# Patient Record
Sex: Female | Born: 1955 | Race: White | Hispanic: No | Marital: Married | Smoking: Never smoker
Health system: Southern US, Community
[De-identification: ages and names within clinical notes are randomized; demographics above are authoritative.]

## PROBLEM LIST (undated history)

## (undated) DIAGNOSIS — I959 Hypotension, unspecified: Secondary | ICD-10-CM

## (undated) DIAGNOSIS — Z9889 Other specified postprocedural states: Secondary | ICD-10-CM

## (undated) DIAGNOSIS — E039 Hypothyroidism, unspecified: Secondary | ICD-10-CM

## (undated) DIAGNOSIS — I499 Cardiac arrhythmia, unspecified: Secondary | ICD-10-CM

## (undated) DIAGNOSIS — I10 Essential (primary) hypertension: Secondary | ICD-10-CM

## (undated) DIAGNOSIS — R112 Nausea with vomiting, unspecified: Secondary | ICD-10-CM

## (undated) HISTORY — PX: CHOLECYSTECTOMY: SHX55

## (undated) HISTORY — PX: ABDOMINAL HYSTERECTOMY: SHX81

## (undated) HISTORY — PX: ABLATION: SHX5711

---

## 1998-04-01 DIAGNOSIS — I499 Cardiac arrhythmia, unspecified: Secondary | ICD-10-CM

## 1998-04-01 HISTORY — DX: Cardiac arrhythmia, unspecified: I49.9

## 1998-10-31 ENCOUNTER — Encounter (INDEPENDENT_AMBULATORY_CARE_PROVIDER_SITE_OTHER): Payer: Self-pay

## 1998-10-31 ENCOUNTER — Other Ambulatory Visit: Admission: RE | Admit: 1998-10-31 | Discharge: 1998-10-31 | Payer: Self-pay | Admitting: Gynecology

## 1998-11-27 ENCOUNTER — Other Ambulatory Visit: Admission: RE | Admit: 1998-11-27 | Discharge: 1998-11-27 | Payer: Self-pay | Admitting: Gynecology

## 2000-03-04 ENCOUNTER — Other Ambulatory Visit: Admission: RE | Admit: 2000-03-04 | Discharge: 2000-03-04 | Payer: Self-pay | Admitting: Gynecology

## 2000-03-26 ENCOUNTER — Encounter (INDEPENDENT_AMBULATORY_CARE_PROVIDER_SITE_OTHER): Payer: Self-pay

## 2000-03-26 ENCOUNTER — Other Ambulatory Visit: Admission: RE | Admit: 2000-03-26 | Discharge: 2000-03-26 | Payer: Self-pay | Admitting: *Deleted

## 2000-04-10 ENCOUNTER — Inpatient Hospital Stay (HOSPITAL_COMMUNITY): Admission: RE | Admit: 2000-04-10 | Discharge: 2000-04-12 | Payer: Self-pay | Admitting: Gynecology

## 2000-04-10 ENCOUNTER — Encounter (INDEPENDENT_AMBULATORY_CARE_PROVIDER_SITE_OTHER): Payer: Self-pay | Admitting: Specialist

## 2001-06-01 ENCOUNTER — Other Ambulatory Visit: Admission: RE | Admit: 2001-06-01 | Discharge: 2001-06-01 | Payer: Self-pay | Admitting: Gynecology

## 2001-11-05 ENCOUNTER — Encounter (INDEPENDENT_AMBULATORY_CARE_PROVIDER_SITE_OTHER): Payer: Self-pay | Admitting: Specialist

## 2001-11-05 ENCOUNTER — Ambulatory Visit (HOSPITAL_COMMUNITY): Admission: RE | Admit: 2001-11-05 | Discharge: 2001-11-05 | Payer: Self-pay | Admitting: Gastroenterology

## 2002-06-28 ENCOUNTER — Other Ambulatory Visit: Admission: RE | Admit: 2002-06-28 | Discharge: 2002-06-28 | Payer: Self-pay | Admitting: Gynecology

## 2004-09-05 ENCOUNTER — Other Ambulatory Visit: Admission: RE | Admit: 2004-09-05 | Discharge: 2004-09-05 | Payer: Self-pay | Admitting: Gynecology

## 2005-11-07 ENCOUNTER — Other Ambulatory Visit: Admission: RE | Admit: 2005-11-07 | Discharge: 2005-11-07 | Payer: Self-pay | Admitting: Gynecology

## 2007-01-01 ENCOUNTER — Other Ambulatory Visit: Admission: RE | Admit: 2007-01-01 | Discharge: 2007-01-01 | Payer: Self-pay | Admitting: Gynecology

## 2008-02-23 ENCOUNTER — Other Ambulatory Visit: Admission: RE | Admit: 2008-02-23 | Discharge: 2008-02-23 | Payer: Self-pay | Admitting: Gynecology

## 2010-08-17 NOTE — H&P (Signed)
[facility]  Patient:    Jody Rodriguez, Jody Rodriguez                    MRN: [id number] Adm. Date:  [id number] Disc. Date: [id number] Attending:  Teodora Medici Cabitt                         History and Physical  ADMISSION DIAGNOSIS:  Pelvic pain.  HISTORY OF PRESENT ILLNESS:  The patient is a 55 year old, gravida 1, para 1 female, admitted with last menstrual period on March 28, 2000, status post tubal ligation, admitted for a total abdominal hysterectomy and bilateral salpingo-oophorectomy.  The patient has a long history of menorrhagia, pelvis pain, and dyspareunia who is admitted for a total abdominal hysterectomy and bilateral salpingo-oophorectomy.  A total abdominal hysterectomy and bilateral salpingo-oophorectomy have been discussed with the patient in detail, and potential complications, including, but not limited to, anesthesia, injury to the bowel, bladder, ureters, possible fistula formation, possible blood loss with transfusion and its sequelae, and possible infection have been discussed in detail with the patient.  The postoperative expectations and restrictions have also been reviewed.  There is no guarantee to relieve the patients pelvic pain.  When the patient appeared for preoperative examination, the hemoglobin was reported as 8.8, and repeated the next day and was 9.2.  The patient stated that her last menstrual flow had been extremely heavy.  PAST SURGICAL HISTORY: 1. Cesarean section. 2. Laparoscopic tubal ligation. 3. Tonsillectomy and appendectomy. 4. Knee.  PAST SURGICAL HISTORY: 1. The patient has had a cardiac catheterization to ablate a nerve to control    a heart arrhythmia. 2. 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  MEDICATIONS:  None.  ALLERGIES:  NEURONTIN.  HABITS:  Smokes none, ETOH occasional.  SOCIAL HISTORY:  The patient has an extremely supportive family.  FAMILY HISTORY:   Noncontributory.  PHYSICAL EXAMINATION:  HEENT:  Negative.  LUNGS:  Clear.  HEART:  Without murmurs.  BREASTS:  Without masses or discharge.  ABDOMEN:  Soft and nontender.  PELVIC:  ______ vagina and cervix to be normal.  The uterus is top normal in size, soft, and very tender to palpation.  The adnexa are without masses.  EXTREMITIES:  Negative.  LABORATORY DATA:  Saline ultrasound was performed which revealed a thick endometrium, and saline ultrasound revealed no gross polyps or fibroids. Endometrial biopsy was performed in December 2001, and revealed benign secretory endometrium, no hyperplasia or malignancy identified.  IMPRESSION: 1. Menorrhagia. 2. Anemia. 3. Pelvic pain. 4. Dyspareunia.  PLAN:  Total abdominal hysterectomy and bilateral salpingo-oophorectomy.  The patient is aware that after the bilateral salpingo-oophorectomy that she will need to consider a hormone replacement therapy. DD:  04/14/00 TD:  04/14/00 Job: [id number]

## 2010-08-17 NOTE — Op Note (Signed)
[facility]  Patient:    Jody Rodriguez, Jody Rodriguez                    MRN: [id number] Proc. Date: 04/10/00 Adm. Date:  [id number] Disc. Date: [id number] Attending:  Teodora Medici Cabitt                           Operative Report  PREOPERATIVE DIAGNOSES:  Pelvic pain, menorrhagia, anemia.  POSTOPERATIVE DIAGNOSES:  Pelvic pain, menorrhagia, anemia.  OPERATION PERFORMED:  Total abdominal hysterectomy and bilateral salpingo-oophorectomy.  SURGEON:  Dr. Teodora Medici.  ASSISTANT:  Dr. Harl Bowie.  ANESTHESIA:  General endotracheal.  PREPARATION:  Betadine.  DESCRIPTION OF PROCEDURE:  With the patient in the supine position, she was prepped and draped in routine fashion. An incision was made through an old Pfannenstiel scar and carried down to the subcutaneous tissue. The fascia and peritoneum were opened without difficulty. Exploration of her upper abdomen revealed no gross abnormalities. Exploration of the pelvis revealed the uterus to be approximately eight weeks in size and the tubes and ovaries were grossly normal bilateral. There was no gross endometriosis. The round ligaments were suture ligated with #1 chromic and divided with cautery. The anterior leaf of the broad ligament was then opened and the bladder was moderately scarred to the uterus in the previous cesarean section and was taken down sharply with care taken to avoid injury or damage to the bladder. The right infundibulopelvic ligament was isolated, clamped, cut and free tied with #1 chromic and then suture ligated with #1 chromic. There were adhesions of bowel extending up the infundibulopelvic ligament on the left side and these were taken down sharply and with cautery to safely expose the ligament. The ligament was exposed, clamped, cut and free tied with #1 chromic and then suture ligated with #1 chromic. The uterine arteries were then clamped, cut and suture ligated with #1 chromic.  The cardinal ligaments taken in several bites clamped, cut and suture ligated with #1 chromic. The uterosacral ligaments were taken separately, clamped, cut and suture ligated with #1 chromic. The cervix was quite large and the fornices were very deep and an effort was made to not take more cardinal ligament than was necessary. The vagina was entered anteriorly and the specimen excised with circumferential dissection. TeLinde type angled sutures were then placed with #1 chromic and the cuff whipped anteriorly and posteriorly with running locked #1 chromic suture. The vascular opening was quite large and was approximately with three interrupted sutures of #1 chromic. Several small bleeding sites at the base of the bladder were arrested with gentle cautery. The bladder flap was then approximated over the vaginal cuff with a running 3-0 Vicryl suture. The ureters were inspected and found to be out of harms way and peristalsing bilaterally. At the completion of the procedure, an effort was made to place the large bowel in the cul-de-sac. The omentum was brought down, the abdomen was closed in layers using a running 2-0 Vicryl on the peritoneum, running #0 Vicryl to midline bilaterally on the fascia. Hemostasis was assured in the subcutaneous tissue and the skin was closed with staples. The estimated blood loss was approximately 250 cc. Sponge, needle and instrument counts were correct x 2. The patient tolerated the procedure well and was taken to the recovery room in satisfactory condition. DD:  04/14/00 TD:  04/14/00 Job: [id number]

## 2013-04-01 HISTORY — PX: CERVICAL SPINE SURGERY: SHX589

## 2017-03-18 ENCOUNTER — Encounter (HOSPITAL_COMMUNITY): Payer: Self-pay | Admitting: Nephrology

## 2017-03-18 ENCOUNTER — Inpatient Hospital Stay (HOSPITAL_COMMUNITY)
Admission: AD | Admit: 2017-03-18 | Discharge: 2017-03-24 | DRG: 438 | Disposition: A | Discharge status: Home / Self Care | Payer: BC Managed Care – PPO | Source: Other Acute Inpatient Hospital | Attending: Internal Medicine | Admitting: Internal Medicine

## 2017-03-18 DIAGNOSIS — K760 Fatty (change of) liver, not elsewhere classified: Secondary | ICD-10-CM | POA: Diagnosis present

## 2017-03-18 DIAGNOSIS — R112 Nausea with vomiting, unspecified: Secondary | ICD-10-CM | POA: Diagnosis not present

## 2017-03-18 DIAGNOSIS — K859 Acute pancreatitis without necrosis or infection, unspecified: Secondary | ICD-10-CM

## 2017-03-18 DIAGNOSIS — E860 Dehydration: Secondary | ICD-10-CM | POA: Diagnosis present

## 2017-03-18 DIAGNOSIS — E039 Hypothyroidism, unspecified: Secondary | ICD-10-CM | POA: Diagnosis present

## 2017-03-18 DIAGNOSIS — R600 Localized edema: Secondary | ICD-10-CM | POA: Diagnosis present

## 2017-03-18 DIAGNOSIS — I10 Essential (primary) hypertension: Secondary | ICD-10-CM

## 2017-03-18 DIAGNOSIS — J9811 Atelectasis: Secondary | ICD-10-CM | POA: Diagnosis not present

## 2017-03-18 DIAGNOSIS — K567 Ileus, unspecified: Secondary | ICD-10-CM | POA: Diagnosis not present

## 2017-03-18 DIAGNOSIS — R0600 Dyspnea, unspecified: Secondary | ICD-10-CM

## 2017-03-18 DIAGNOSIS — Z888 Allergy status to other drugs, medicaments and biological substances status: Secondary | ICD-10-CM

## 2017-03-18 DIAGNOSIS — Z9049 Acquired absence of other specified parts of digestive tract: Secondary | ICD-10-CM | POA: Diagnosis not present

## 2017-03-18 DIAGNOSIS — R0902 Hypoxemia: Secondary | ICD-10-CM

## 2017-03-18 DIAGNOSIS — R072 Precordial pain: Secondary | ICD-10-CM

## 2017-03-18 DIAGNOSIS — K831 Obstruction of bile duct: Secondary | ICD-10-CM | POA: Diagnosis present

## 2017-03-18 DIAGNOSIS — Z79899 Other long term (current) drug therapy: Secondary | ICD-10-CM | POA: Diagnosis not present

## 2017-03-18 DIAGNOSIS — K851 Biliary acute pancreatitis without necrosis or infection: Principal | ICD-10-CM | POA: Diagnosis present

## 2017-03-18 DIAGNOSIS — R1012 Left upper quadrant pain: Secondary | ICD-10-CM

## 2017-03-18 DIAGNOSIS — E8770 Fluid overload, unspecified: Secondary | ICD-10-CM | POA: Diagnosis not present

## 2017-03-18 DIAGNOSIS — D649 Anemia, unspecified: Secondary | ICD-10-CM | POA: Diagnosis not present

## 2017-03-18 DIAGNOSIS — R52 Pain, unspecified: Secondary | ICD-10-CM

## 2017-03-18 DIAGNOSIS — R1011 Right upper quadrant pain: Secondary | ICD-10-CM | POA: Diagnosis not present

## 2017-03-18 DIAGNOSIS — E871 Hypo-osmolality and hyponatremia: Secondary | ICD-10-CM | POA: Diagnosis not present

## 2017-03-18 DIAGNOSIS — R109 Unspecified abdominal pain: Secondary | ICD-10-CM | POA: Diagnosis present

## 2017-03-18 DIAGNOSIS — N289 Disorder of kidney and ureter, unspecified: Secondary | ICD-10-CM | POA: Diagnosis present

## 2017-03-18 HISTORY — DX: Nausea with vomiting, unspecified: R11.2

## 2017-03-18 HISTORY — DX: Hypothyroidism, unspecified: E03.9

## 2017-03-18 HISTORY — DX: Hypotension, unspecified: I95.9

## 2017-03-18 HISTORY — DX: Cardiac arrhythmia, unspecified: I49.9

## 2017-03-18 HISTORY — DX: Essential (primary) hypertension: I10

## 2017-03-18 HISTORY — DX: Nausea with vomiting, unspecified: Z98.890

## 2017-03-18 LAB — CBC WITH DIFFERENTIAL/PLATELET
BASOS ABS: 0 10*3/uL (ref 0.0–0.1)
BASOS PCT: 0 %
EOS PCT: 0 %
Eosinophils Absolute: 0 10*3/uL (ref 0.0–0.7)
HCT: 45 % (ref 36.0–46.0)
Hemoglobin: 15.3 g/dL — ABNORMAL HIGH (ref 12.0–15.0)
Lymphocytes Relative: 9 %
Lymphs Abs: 0.9 10*3/uL (ref 0.7–4.0)
MCH: 32.3 pg (ref 26.0–34.0)
MCHC: 34 g/dL (ref 30.0–36.0)
MCV: 95.1 fL (ref 78.0–100.0)
MONO ABS: 0.5 10*3/uL (ref 0.1–1.0)
Monocytes Relative: 6 %
Neutro Abs: 8.2 10*3/uL — ABNORMAL HIGH (ref 1.7–7.7)
Neutrophils Relative %: 85 %
PLATELETS: 346 10*3/uL (ref 150–400)
RBC: 4.73 MIL/uL (ref 3.87–5.11)
RDW: 13.9 % (ref 11.5–15.5)
WBC: 9.6 10*3/uL (ref 4.0–10.5)

## 2017-03-18 LAB — COMPREHENSIVE METABOLIC PANEL
ALK PHOS: 195 U/L — AB (ref 38–126)
ALT: 261 U/L — AB (ref 14–54)
ANION GAP: 9 (ref 5–15)
AST: 193 U/L — ABNORMAL HIGH (ref 15–41)
Albumin: 3.6 g/dL (ref 3.5–5.0)
BUN: 16 mg/dL (ref 6–20)
CALCIUM: 8.7 mg/dL — AB (ref 8.9–10.3)
CHLORIDE: 104 mmol/L (ref 101–111)
CO2: 23 mmol/L (ref 22–32)
CREATININE: 1.11 mg/dL — AB (ref 0.44–1.00)
GFR, EST NON AFRICAN AMERICAN: 53 mL/min — AB (ref >60)
Glucose, Bld: 125 mg/dL — ABNORMAL HIGH (ref 65–99)
Potassium: 4.9 mmol/L (ref 3.5–5.1)
SODIUM: 136 mmol/L (ref 135–145)
Total Bilirubin: 1.4 mg/dL — ABNORMAL HIGH (ref 0.3–1.2)
Total Protein: 6.8 g/dL (ref 6.5–8.1)

## 2017-03-18 LAB — MAGNESIUM: MAGNESIUM: 1.9 mg/dL (ref 1.7–2.4)

## 2017-03-18 LAB — LIPASE, BLOOD: LIPASE: 1765 U/L — AB (ref 11–51)

## 2017-03-18 LAB — PHOSPHORUS: PHOSPHORUS: 4 mg/dL (ref 2.5–4.6)

## 2017-03-18 MED ORDER — ONDANSETRON HCL 4 MG PO TABS
4.0000 mg | ORAL_TABLET | Freq: Four times a day (QID) | ORAL | Status: DC | PRN
  Administered 2017-03-24: 4 mg via ORAL
  Filled 2017-03-18: qty 1

## 2017-03-18 MED ORDER — SODIUM CHLORIDE 0.45 % IV SOLN
INTRAVENOUS | Status: AC
  Administered 2017-03-18 (×3): via INTRAVENOUS

## 2017-03-18 MED ORDER — SODIUM CHLORIDE 0.9% FLUSH: 9.0000 mL | INTRAVENOUS | Status: DC | PRN

## 2017-03-18 MED ORDER — DIPHENHYDRAMINE HCL 12.5 MG/5ML PO ELIX: 12.5000 mg | ORAL_SOLUTION | Freq: Four times a day (QID) | ORAL | Status: DC | PRN

## 2017-03-18 MED ORDER — NALOXONE HCL 0.4 MG/ML IJ SOLN: 0.4000 mg | INTRAMUSCULAR | Status: DC | PRN

## 2017-03-18 MED ORDER — MORPHINE SULFATE 2 MG/ML IV SOLN
INTRAVENOUS | Status: DC
  Administered 2017-03-18 – 2017-03-19 (×2): via INTRAVENOUS
  Administered 2017-03-19: 15 mg via INTRAVENOUS
  Administered 2017-03-19: 22.5 mg via INTRAVENOUS
  Administered 2017-03-19: 16.45 mg via INTRAVENOUS
  Administered 2017-03-19: 6 mg via INTRAVENOUS
  Administered 2017-03-19: 27 mg via INTRAVENOUS
  Administered 2017-03-20: 20:00:00 via INTRAVENOUS
  Administered 2017-03-20: 35 mg via INTRAVENOUS
  Administered 2017-03-20: 10.5 mg via INTRAVENOUS
  Administered 2017-03-20: 22.5 mg via INTRAVENOUS
  Administered 2017-03-20: 05:00:00 via INTRAVENOUS
  Administered 2017-03-20: 10.5 mg via INTRAVENOUS
  Administered 2017-03-21: 3 mg via INTRAVENOUS
  Administered 2017-03-21: 10.5 mg via INTRAVENOUS
  Filled 2017-03-18 (×4): qty 30

## 2017-03-18 MED ORDER — ONDANSETRON HCL 4 MG/2ML IJ SOLN
4.0000 mg | Freq: Four times a day (QID) | INTRAMUSCULAR | Status: DC | PRN
  Filled 2017-03-18: qty 2

## 2017-03-18 MED ORDER — DIPHENHYDRAMINE HCL 50 MG/ML IJ SOLN
12.5000 mg | Freq: Four times a day (QID) | INTRAMUSCULAR | Status: DC | PRN
  Administered 2017-03-20 – 2017-03-21 (×2): 12.5 mg via INTRAVENOUS
  Filled 2017-03-18 (×2): qty 1

## 2017-03-18 MED ORDER — SODIUM CHLORIDE 0.45 % IV SOLN
INTRAVENOUS | Status: AC
  Administered 2017-03-19: 05:00:00 via INTRAVENOUS

## 2017-03-18 MED ORDER — MORPHINE SULFATE (PF) 4 MG/ML IV SOLN
2.0000 mg | INTRAVENOUS | Status: AC | PRN
Start: 2017-03-18 — End: 2017-03-19
  Administered 2017-03-18: 4 mg via INTRAVENOUS
  Filled 2017-03-18: qty 1

## 2017-03-18 MED ORDER — ONDANSETRON HCL 4 MG/2ML IJ SOLN
4.0000 mg | Freq: Four times a day (QID) | INTRAMUSCULAR | Status: DC | PRN
  Administered 2017-03-18 – 2017-03-24 (×13): 4 mg via INTRAVENOUS
  Filled 2017-03-18 (×12): qty 2

## 2017-03-18 NOTE — H&P (Addendum)
[facility] History and Physical  Jody Rodriguez ZOX:[id number] DOB: December 23, 1955 DOA: 03/18/2017  Referring physician: Dr Malachi BondsShort PCP: Patient, No Pcp Per   Chief Complaint: Abd pain and chest pain  HPI: Jody KinnierBeverly L Rodriguez is a 61 y.o. female with hx of HTN, cholecystectomy [facility] ED [facility] with SSCP and abd pain with diaphoresis and frequent N/V , onset earlier today somewhat acute.  ED eval showed ^LFT"s/ tbili, and lipase of 87,000.  CT abd showed acute pancreatitis w/o necrosis/ pseudocyst, dilated CBD to 14 mm, abrupt tapering of distal CBD just above the ampulla, no radiopaque stones noted.  Also had low density lesion in the uncinate process , 7mm, MRI recommended in 6 mos or sooner if needed.  [facility].   Pt still having significant abd pain and chest pain.  No active N/V.  She rec'd multiple IV doses of pain meds and nausea medications in the ED at [facility].    She has hx of HTN, meds are pending.  She describes problems with surgery/ anesthesia which causes her BP to go"out of control" in the past.  She had to have neck surgery for "collapsed bones" which were pressing on her spinal cord.  No sig sequelae.    Pt is married, lives with her husband in [place], [place].  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    ROS  denies CP  no joint pain   no HA  no blurry vision  no rash  no diarrhea  no nausea/ vomiting  no dysuria  no difficulty voiding  no change in urine color    Past Medical History  Past Medical History:  Diagnosis Date  . Essential hypertension    Past Surgical History  Past Surgical History:  Procedure Laterality Date  . CHOLECYSTECTOMY     Family History No family history on file. Social History  reports that  has never smoked. She does not have any smokeless tobacco history on file. She reports that  she drinks alcohol. She reports that she does not use drugs. Allergies Allergies not on file Home medications Prior to Admission medications   Not on File   Liver Function Tests No results for input(s): AST, ALT, ALKPHOS, BILITOT, PROT, ALBUMIN in the last 168 hours. No results for input(s): LIPASE, AMYLASE in the last 168 hours. CBC No results for input(s): WBC, NEUTROABS, HGB, HCT, MCV, PLT in the last 168 hours. Basic Metabolic Panel No results for input(s): NA, K, CL, CO2, GLUCOSE, BUN, CREATININE, CALCIUM, PHOS in the last 168 hours.  Invalid input(s): ALB   There were no vitals filed for this visit. Exam: Gen alert, pleasant WDWN, no distress No rash, cyanosis or gangrene Sclera anicteric, throat clear  No jvd or bruits  Chest clear bilat RRR no MRG Abd marked mid abdomen and L mid abdomen tenderness with guarding, no rebound, dec'd BS, no LQ tenderness or CVAT, no mass or hsm MS no joint effusions or deformity Ext no LE edema / no wounds or ulcers Neuro is alert, Ox 3 , nf    Home meds: -ambien 10 hs/ estrogens cong.  0.625 hs/ ecasa 81 qd/ omeprazole 40 qd/ zantac 150 qd -enalapril 10 qd   EKG (independ reviewed) > [facility].  HR 73. Normal EKG.    Assessment: 1.  Acute chest pain / abd pain - per CT/ labs this is acute pancreatitis w/ biliary obstruction but no stone seen on CT. CT also showing a small 7 mm lesion uncinate process, and a tapered CBD distal CBD c/w tumor vs stricture. Pt does not look septic.  Have d/w GI on call, distal CBD obstruction could be tumor/ stricture or radiolucent stone. Will need MRI w MRCP, will wait until tomorrow to see that creat is stable/  improving first. Move to SDU.  Aggressive IVF's.  Reorder all baseline labs here. Place foley.   2. HTN - hold enalapril for now 3. Acute renal insufficiency - creat 1.4, presumably this is up from dehydration. No baseline here. Repeat in am after IVF's.  4. Leukocytosis - prob d/t  inflammation, acute illness. Hold abx for now.    Plan - as above       Jla Reynolds D [facility] Pager [phone]   If 7PM-7AM, please contact night-coverage [email or web address] Password [email or web address] 03/18/2017, 7:42 PM

## 2017-03-19 ENCOUNTER — Other Ambulatory Visit: Payer: Self-pay

## 2017-03-19 ENCOUNTER — Inpatient Hospital Stay (HOSPITAL_COMMUNITY): Payer: BC Managed Care – PPO

## 2017-03-19 LAB — CBC
HCT: 44.3 % (ref 36.0–46.0)
HEMATOCRIT: 40.3 % (ref 36.0–46.0)
HEMOGLOBIN: 13.2 g/dL (ref 12.0–15.0)
Hemoglobin: 14.9 g/dL (ref 12.0–15.0)
MCH: 32 pg (ref 26.0–34.0)
MCH: 31.7 pg (ref 26.0–34.0)
MCHC: 32.8 g/dL (ref 30.0–36.0)
MCHC: 33.6 g/dL (ref 30.0–36.0)
MCV: 95.3 fL (ref 78.0–100.0)
MCV: 96.9 fL (ref 78.0–100.0)
Platelets: 327 10*3/uL (ref 150–400)
Platelets: 285 10*3/uL (ref 150–400)
RBC: 4.65 MIL/uL (ref 3.87–5.11)
RBC: 4.16 MIL/uL (ref 3.87–5.11)
RDW: 14.1 % (ref 11.5–15.5)
RDW: 14.2 % (ref 11.5–15.5)
WBC: 10.8 10*3/uL — ABNORMAL HIGH (ref 4.0–10.5)
WBC: 11.6 10*3/uL — ABNORMAL HIGH (ref 4.0–10.5)

## 2017-03-19 LAB — COMPREHENSIVE METABOLIC PANEL
ALBUMIN: 3.6 g/dL (ref 3.5–5.0)
ALT: 239 U/L — ABNORMAL HIGH (ref 14–54)
ANION GAP: 9 (ref 5–15)
AST: 164 U/L — ABNORMAL HIGH (ref 15–41)
Alkaline Phosphatase: 188 U/L — ABNORMAL HIGH (ref 38–126)
BUN: 16 mg/dL (ref 6–20)
CO2: 23 mmol/L (ref 22–32)
Calcium: 8.2 mg/dL — ABNORMAL LOW (ref 8.9–10.3)
Chloride: 103 mmol/L (ref 101–111)
Creatinine, Ser: 1.01 mg/dL — ABNORMAL HIGH (ref 0.44–1.00)
GFR calc Af Amer: >60 mL/min (ref >60)
GFR calc non Af Amer: 59 mL/min — ABNORMAL LOW (ref >60)
GLUCOSE: 102 mg/dL — AB (ref 65–99)
POTASSIUM: 4.6 mmol/L (ref 3.5–5.1)
SODIUM: 135 mmol/L (ref 135–145)
TOTAL PROTEIN: 6.6 g/dL (ref 6.5–8.1)
Total Bilirubin: 1.2 mg/dL (ref 0.3–1.2)

## 2017-03-19 LAB — LIPID PANEL
CHOLESTEROL: 257 mg/dL — AB (ref 0–200)
HDL: 85 mg/dL (ref >40)
LDL Cholesterol: 156 mg/dL — ABNORMAL HIGH (ref 0–99)
Total CHOL/HDL Ratio: 3 RATIO
Triglycerides: 78 mg/dL (ref <150)
VLDL: 16 mg/dL (ref 0–40)

## 2017-03-19 LAB — HIV ANTIBODY (ROUTINE TESTING W REFLEX): HIV SCREEN 4TH GENERATION: NONREACTIVE

## 2017-03-19 LAB — LIPASE, BLOOD: Lipase: 1468 U/L — ABNORMAL HIGH (ref 11–51)

## 2017-03-19 LAB — CREATININE, SERUM
Creatinine, Ser: 0.92 mg/dL (ref 0.44–1.00)
GFR calc Af Amer: >60 mL/min (ref >60)
GFR calc non Af Amer: >60 mL/min (ref >60)

## 2017-03-19 LAB — TROPONIN I
Troponin I: <0.03 ng/mL (ref <0.03)
Troponin I: <0.03 ng/mL (ref <0.03)

## 2017-03-19 LAB — PROTIME-INR
INR: 0.93
Prothrombin Time: 12.3 seconds (ref 11.4–15.2)

## 2017-03-19 MED ORDER — HYDROMORPHONE HCL 1 MG/ML IJ SOLN
1.0000 mg | INTRAMUSCULAR | Status: AC | PRN
  Administered 2017-03-19: 1 mg via INTRAVENOUS
  Filled 2017-03-19: qty 1

## 2017-03-19 MED ORDER — ENOXAPARIN SODIUM 40 MG/0.4ML ~~LOC~~ SOLN
40.0000 mg | SUBCUTANEOUS | Status: DC
  Administered 2017-03-19 – 2017-03-20 (×2): 40 mg via SUBCUTANEOUS
  Filled 2017-03-19 (×2): qty 0.4

## 2017-03-19 MED ORDER — GADOBENATE DIMEGLUMINE 529 MG/ML IV SOLN
20.0000 mL | Freq: Once | INTRAVENOUS | Status: AC | PRN
  Administered 2017-03-19: 17 mL via INTRAVENOUS

## 2017-03-19 MED ORDER — PROMETHAZINE HCL 25 MG/ML IJ SOLN
12.5000 mg | Freq: Four times a day (QID) | INTRAMUSCULAR | Status: DC | PRN
Start: 2017-03-19 — End: 2017-03-24
  Administered 2017-03-19 (×2): 12.5 mg via INTRAVENOUS
  Filled 2017-03-19 (×2): qty 1

## 2017-03-19 MED ORDER — SODIUM CHLORIDE 0.45 % IV SOLN
INTRAVENOUS | Status: DC
  Administered 2017-03-19 – 2017-03-20 (×3): via INTRAVENOUS

## 2017-03-19 NOTE — Consult Note (Signed)
[facility] Reason for consult: Gallstone pancreatitis Referring Physician: [facility].  Primary GI: None patient is unassigned  Jody Rodriguez is an 61 y.o. female.  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  She went to court yesterday morning and felt fine with no symptoms ate breakfast.  She started becoming nauseated very suddenly and developed abdominal pain.  This led to her going to the emergency room and she was reported to have an lipase of 87,000 LFTs were elevated she underwent a CT scan normal liver dilated CBD 14 mm consistent with her prior history of cholecystectomy with no clear stones in the ductal system.  There were marked inflammatory changes around the pancreas and a small lesion in the uncinate process with fairly uniform enhancement of the pancreatic parenchyma with no evidence of necrosis or pseudocyst.  She was transferred here for further treatment. The patient's history is remarkable for prior hysterectomy and cholecystectomy.  She has some mild hypertension.  Her medications on admission included Vasotec, estrogen, thyroid Ambien and amitriptyline.  She also was on BuSpar for anxiety.  She has been on all these medications for some time and has not started any new medications recently.  She denies any herbs from the health food store etc.  She does drink but not regularly.  She went to [place] in November for a week and did drink a bit more there than normal but does not drink every day may be several drinks a week.  Her labs here showed the lipase had decreased to 1700 total bilirubin is 1.2 actually down.  WBC 10.8.  The patient is being treated with narcotics for pain and an MRCP is ordered.   Past Medical History:  Diagnosis Date  . Dysrhythmia 2000   ablation for afib  . Essential hypertension   . Hypotension   . Hypothyroidism   . PONV (postoperative nausea and vomiting)    blood pressure up and down after anethesia     Past Surgical History:  Procedure Laterality Date  . ABDOMINAL HYSTERECTOMY    . ABLATION     for afib  . CERVICAL SPINE SURGERY  2015  . CHOLECYSTECTOMY      History reviewed. No pertinent family history.  Social History:  reports that  has never smoked. she has never used smokeless tobacco. She reports that she drinks alcohol. She reports that she does not use drugs.  Allergies:  Allergies  Allergen Reactions  . Gabapentin Shortness Of Breath    Medications; Prior to Admission medications   Medication Sig Start Date End Date Taking? Authorizing Provider  amitriptyline (ELAVIL) 25 MG tablet Take 25 mg by mouth daily.    Yes [provider]  BusPIRone HCl (BUSPAR PO) Take 15 mg of piperacillin by mouth daily as needed (anxiety).    Yes [provider]  CALCIUM PO Take 1 tablet by mouth daily.   Yes [provider]  enalapril (VASOTEC) 20 MG tablet Take 20 mg by mouth daily.    Yes [provider]  estrogens, conjugated, (PREMARIN) 0.3 MG tablet Take 0.3 mg by mouth daily.    Yes [provider]  Levothyroxine Sodium 50 MCG CAPS Take 50 mcg by mouth daily.    Yes [provider]  METOPROLOL TARTRATE PO Take 0.5 tablets by mouth 2 (two) times daily.   Yes [provider]  tiZANidine (ZANAFLEX) 2 MG tablet Take 2 mg by mouth as needed (muscle spasms).  Yes [provider]  triamterene-hydrochlorothiazide (DYAZIDE) 37.5-25 MG capsule Take 1 capsule by mouth daily.   Yes [provider]  zolpidem (AMBIEN) 10 MG tablet Take 10 mg by mouth at bedtime.   Yes [provider]   . morphine   Intravenous Q4H   PRN Meds diphenhydrAMINE **OR** diphenhydrAMINE, naloxone **AND** sodium chloride flush, ondansetron **OR** ondansetron (ZOFRAN) IV, promethazine Results for orders placed or performed during the hospital encounter of 03/18/17 (from the past 48 hour(s))  Comprehensive metabolic panel      Status: Abnormal   Collection Time: 03/18/17  9:06 PM  Result Value Ref Range   Sodium 136 135 - 145 mmol/L   Potassium 4.9 3.5 - 5.1 mmol/L   Chloride 104 101 - 111 mmol/L   CO2 23 22 - 32 mmol/L   Glucose, Bld 125 (H) 65 - 99 mg/dL   BUN 16 6 - 20 mg/dL   Creatinine, Ser 1.11 (H) 0.44 - 1.00 mg/dL   Calcium 8.7 (L) 8.9 - 10.3 mg/dL   Total Protein 6.8 6.5 - 8.1 g/dL   Albumin 3.6 3.5 - 5.0 g/dL   AST 193 (H) 15 - 41 U/L   ALT 261 (H) 14 - 54 U/L   Alkaline Phosphatase 195 (H) 38 - 126 U/L   Total Bilirubin 1.4 (H) 0.3 - 1.2 mg/dL   GFR calc non Af Amer 53 (L) >60 mL/min   GFR calc Af Amer >60 >60 mL/min    Comment: (NOTE) The eGFR has been calculated using the CKD EPI equation. This calculation has not been validated in all clinical situations. eGFR's persistently <60 mL/min signify possible Chronic Kidney Disease.    Anion gap 9 5 - 15  Magnesium     Status: None   Collection Time: 03/18/17  9:06 PM  Result Value Ref Range   Magnesium 1.9 1.7 - 2.4 mg/dL  Phosphorus     Status: None   Collection Time: 03/18/17  9:06 PM  Result Value Ref Range   Phosphorus 4.0 2.5 - 4.6 mg/dL  CBC WITH DIFFERENTIAL     Status: Abnormal   Collection Time: 03/18/17  9:06 PM  Result Value Ref Range   WBC 9.6 4.0 - 10.5 K/uL   RBC 4.73 3.87 - 5.11 MIL/uL   Hemoglobin 15.3 (H) 12.0 - 15.0 g/dL   HCT 45.0 36.0 - 46.0 %   MCV 95.1 78.0 - 100.0 fL   MCH 32.3 26.0 - 34.0 pg   MCHC 34.0 30.0 - 36.0 g/dL   RDW 13.9 11.5 - 15.5 %   Platelets 346 150 - 400 K/uL   Neutrophils Relative % 85 %   Neutro Abs 8.2 (H) 1.7 - 7.7 K/uL   Lymphocytes Relative 9 %   Lymphs Abs 0.9 0.7 - 4.0 K/uL   Monocytes Relative 6 %   Monocytes Absolute 0.5 0.1 - 1.0 K/uL   Eosinophils Relative 0 %   Eosinophils Absolute 0.0 0.0 - 0.7 K/uL   Basophils Relative 0 %   Basophils Absolute 0.0 0.0 - 0.1 K/uL  Lipase, blood     Status: Abnormal   Collection Time: 03/18/17  9:06 PM  Result Value Ref Range   Lipase  1,765 (H) 11 - 51 U/L    Comment: RESULTS CONFIRMED BY MANUAL DILUTION  CBC     Status: Abnormal   Collection Time: 03/19/17 12:50 AM  Result Value Ref Range   WBC 10.8 (H) 4.0 - 10.5 K/uL   RBC 4.65  3.87 - 5.11 MIL/uL   Hemoglobin 14.9 12.0 - 15.0 g/dL   HCT 44.3 36.0 - 46.0 %   MCV 95.3 78.0 - 100.0 fL   MCH 32.0 26.0 - 34.0 pg   MCHC 33.6 30.0 - 36.0 g/dL   RDW 14.2 11.5 - 15.5 %   Platelets 327 150 - 400 K/uL  Comprehensive metabolic panel     Status: Abnormal   Collection Time: 03/19/17 12:50 AM  Result Value Ref Range   Sodium 135 135 - 145 mmol/L   Potassium 4.6 3.5 - 5.1 mmol/L   Chloride 103 101 - 111 mmol/L   CO2 23 22 - 32 mmol/L   Glucose, Bld 102 (H) 65 - 99 mg/dL   BUN 16 6 - 20 mg/dL   Creatinine, Ser 1.01 (H) 0.44 - 1.00 mg/dL   Calcium 8.2 (L) 8.9 - 10.3 mg/dL   Total Protein 6.6 6.5 - 8.1 g/dL   Albumin 3.6 3.5 - 5.0 g/dL   AST 164 (H) 15 - 41 U/L   ALT 239 (H) 14 - 54 U/L   Alkaline Phosphatase 188 (H) 38 - 126 U/L   Total Bilirubin 1.2 0.3 - 1.2 mg/dL   GFR calc non Af Amer 59 (L) >60 mL/min   GFR calc Af Amer >60 >60 mL/min    Comment: (NOTE) The eGFR has been calculated using the CKD EPI equation. This calculation has not been validated in all clinical situations. eGFR's persistently <60 mL/min signify possible Chronic Kidney Disease.    Anion gap 9 5 - 15  Lipase, blood     Status: Abnormal   Collection Time: 03/19/17 12:50 AM  Result Value Ref Range   Lipase 1,468 (H) 11 - 51 U/L    Comment: RESULTS CONFIRMED BY MANUAL DILUTION  Protime-INR     Status: None   Collection Time: 03/19/17 12:50 AM  Result Value Ref Range   Prothrombin Time 12.3 11.4 - 15.2 seconds   INR 0.93   Troponin I     Status: None   Collection Time: 03/19/17  9:03 AM  Result Value Ref Range   Troponin I <0.03 <0.03 ng/mL  Lipid panel     Status: Abnormal   Collection Time: 03/19/17  9:03 AM  Result Value Ref Range   Cholesterol 257 (H) 0 - 200 mg/dL   Triglycerides  78 <150 mg/dL   HDL 85 >40 mg/dL   Total CHOL/HDL Ratio 3.0 RATIO   VLDL 16 0 - 40 mg/dL   LDL Cholesterol 156 (H) 0 - 99 mg/dL    Comment:        Total Cholesterol/HDL:CHD Risk Coronary Heart Disease Risk Table                     Men   Women  1/2 Average Risk   3.4   3.3  Average Risk       5.0   4.4  2 X Average Risk   9.6   7.1  3 X Average Risk  23.4   11.0        Use the calculated Patient Ratio above and the CHD Risk Table to determine the patient's CHD Risk.        ATP III CLASSIFICATION (LDL):  <100     mg/dL   Optimal  100-129  mg/dL   Near or Above                    Optimal  130-159  mg/dL   Borderline  160-189  mg/dL   High  >190     mg/dL   Very High     No results found.             Blood pressure (!) 136/59, pulse (!) 59, temperature 98.3 F (36.8 C), temperature source Oral, resp. rate 15, SpO2 95 %.  Physical exam:   General--Pleasant white female who is having obvious pain and does doze off somewhat during the interview process. ENT--nonicteric Neck--supple Heart--somewhat tachycardic Lungs--clear Abdomen--somewhat obese and slightly distended with no bowel sounds.  There is diffuse tenderness. Psych--patient is somewhat somnolent from medications but answers questions appropriately   Assessment: 1.  Gallstone pancreatitis.  It is likely that she did pass the stone since her bilirubin is actually diminished.  Hopefully she will improve without any complications.  Plan: 1.  Agree with moving ahead with MRCP as you are and aggressively giving her IV fluids and treating her pain 2.  We will allow ice chips 3.  Long discussion with the patient and her husband about gallstone pancreatitis.  We discussed the fact that if the stone is found on MRCP she may need a procedure to remove the stone.  If no stone is found we will likely continue to treat her symptomatically.  Approximately 20 minutes spent discussing all of this with the patient and  her husband.   Nancy Fetter 03/19/2017, 12:12 PM   This note was created using voice recognition software and minor errors may Have occurred unintentionally. Pager: [phone] If no answer or after hours call [phone]

## 2017-03-19 NOTE — Progress Notes (Signed)
PROGRESS NOTE    Jody Rodriguez  BJY:[id number] DOB: [birth year] DOA: 03/18/2017 PCP: Patient, No Pcp Per  Brief Narrative:61 y.o. female with hx of HTN, cholecystectomy [facility] today with SSCP and abd pain with diaphoresis and frequent N/V , onset earlier today somewhat acute.  ED eval showed ^LFT"s/ tbili, and lipase of 87,000.  CT abd showed acute pancreatitis w/o necrosis/ pseudocyst, dilated CBD to 14 mm, abrupt tapering of distal CBD just above the ampulla, no radiopaque stones noted.  Also had low density lesion in the uncinate process , 7mm, MRI recommended in 6 mos or sooner if needed.  [facility].   Pt still having significant abd pain and chest pain.  No active N/V.  She rec'd multiple IV doses of pain meds and nausea medications in the ED [facility].    She has hx of HTN, meds are pending.  She describes problems with surgery/ anesthesia which causes her BP to go"out of control" in the past.  She had to have neck surgery for "collapsed bones" which were pressing on her spinal cord.  No sig sequelae.       Assessment & Plan:   Principal Problem:   Acute pancreatitis Active Problems:   Common bile duct (CBD) obstruction   Nausea & vomiting   Abdominal pain   Substernal chest pain   Acute renal insufficiency   Dehydration   Essential hypertension   Acute biliary pancreatitis  1.  abd pain - per CT/ labs this is acute pancreatitis w/ biliary obstruction but no stone seen on CT. CT also showing a small 7 mm lesion uncinate process, and a tapered CBD distal CBD c/w tumor vs stricture.appreciate gi consult. Aggressive IVF's.  2. HTN - hold enalapril for now 3. Acute renal insufficiency - creat 1.4, presumably this is up from dehydration. No baseline here. Repeat in am after IVF's.  4. Leukocytosis - prob d/t inflammation, acute illness. Hold abx for now.  5. Chest pain-will get troponins.       DVT  prophylaxis:lovenox Code Status:full Family Communication:no family available Disposition Plantbd Consultants:  gi Procedures:none Antimicrobials: none  Subjective:co nausea,abdominal pain.   Objective:resting in bed.. Vitals:   03/19/17 0602 03/19/17 0742 03/19/17 1213 03/19/17 1510  BP: (!) 136/59   122/77  Pulse: (!) 59   96  Resp: 16 15 (!) 24 20  Temp: 98.3 F (36.8 C)   98.2 F (36.8 C)  TempSrc: Oral   Oral  SpO2: 98% 95% 96% 98%    Intake/Output Summary (Last 24 hours) at 03/19/2017 1645 Last data filed at 03/19/2017 1500 Gross per 24 hour  Intake 700 ml  Output 800 ml  Net -100 ml   There were no vitals filed for this visit.  Examination:  General exam: Appears restless  Respiratory system: Clear to auscultation. Respiratory effort normal. Cardiovascular system: S1 & S2 heard, RRR. No JVD, murmurs, rubs, gallops or clicks. No pedal edema. Gastrointestinal system: Abdomen is nondistended, soft and nontender. No organomegaly or masses felt. Normal bowel sounds heard. Central nervous system: Alert and oriented. No focal neurological deficits. Extremities: Symmetric 5 x 5 power. Skin: No rashes, lesions or ulcers Psychiatry: Judgement and insight appear normal. Mood & affect appropriate.     Data Reviewed: I have personally reviewed following labs and imaging studies  CBC: Recent Labs  Lab 03/18/17 2106 03/19/17 0050  WBC 9.6 10.8*  NEUTROABS 8.2*  --   HGB  15.3* 14.9  HCT 45.0 44.3  MCV 95.1 95.3  PLT 346 327   Basic Metabolic Panel: Recent Labs  Lab 03/18/17 2106 03/19/17 0050  NA 136 135  K 4.9 4.6  CL 104 103  CO2 23 23  GLUCOSE 125* 102*  BUN 16 16  CREATININE 1.11* 1.01*  CALCIUM 8.7* 8.2*  MG 1.9  --   PHOS 4.0  --    GFR: CrCl cannot be calculated (Unknown ideal weight.). Liver Function Tests: Recent Labs  Lab 03/18/17 2106 03/19/17 0050  AST 193* 164*  ALT 261* 239*  ALKPHOS 195* 188*  BILITOT 1.4* 1.2  PROT 6.8 6.6   ALBUMIN 3.6 3.6   Recent Labs  Lab 03/18/17 2106 03/19/17 0050  LIPASE 1,765* 1,468*   No results for input(s): AMMONIA in the last 168 hours. Coagulation Profile: Recent Labs  Lab 03/19/17 0050  INR 0.93   Cardiac Enzymes: Recent Labs  Lab 03/19/17 0903 03/19/17 1439  TROPONINI <0.03 <0.03   BNP (last 3 results) No results for input(s): PROBNP in the last 8760 hours. HbA1C: No results for input(s): HGBA1C in the last 72 hours. CBG: No results for input(s): GLUCAP in the last 168 hours. Lipid Profile: Recent Labs    03/19/17 0903  CHOL 257*  HDL 85  LDLCALC 156*  TRIG 78  CHOLHDL 3.0   Thyroid Function Tests: No results for input(s): TSH, T4TOTAL, FREET4, T3FREE, THYROIDAB in the last 72 hours. Anemia Panel: No results for input(s): VITAMINB12, FOLATE, FERRITIN, TIBC, IRON, RETICCTPCT in the last 72 hours. Sepsis Labs: No results for input(s): PROCALCITON, LATICACIDVEN in the last 168 hours.  No results found for this or any previous visit (from the past 240 hour(s)).       Radiology Studies: No results found.      Scheduled Meds: . morphine   Intravenous Q4H   Continuous Infusions: . sodium chloride 200 mL/hr at 03/19/17 1530     LOS: 1 day       Alwyn RenElizabeth G Mathews, MD [facility]  If 7PM-7AM, please contact night-coverage [email or web address] Password [email or web address] 03/19/2017, 4:45 PM

## 2017-03-19 NOTE — Care Management Note (Signed)
Case Management Note  Patient Details  Name: Jody KinnierBeverly L Rodriguez MRN: [id number] Date of Birth: 08-16-1955  Subjective/Objective:                  Pancreatitis lipase of 87,000  Action/Plan: Date: March 19, 2017 Marcelle SmilingRhonda Davis, BSN, MinklerRN3, [place] [phone] Chart and notes review for patient progress and needs. Will follow for case management and discharge needs. Next review date: [id number]  Expected Discharge Date:  (unknown)               Expected Discharge Plan:  Home/Self Care  In-House Referral:     Discharge planning Services  CM Consult  Post Acute Care Choice:    Choice offered to:     DME Arranged:    DME Agency:     HH Arranged:    HH Agency:     Status of Service:  In process, will continue to follow  If discussed at Long Length of Stay Meetings, dates discussed:    Additional Comments:  Golda AcreDavis, Rhonda Lynn, RN 03/19/2017, 9:12 AM

## 2017-03-20 LAB — COMPREHENSIVE METABOLIC PANEL
ALBUMIN: 2.8 g/dL — AB (ref 3.5–5.0)
ALK PHOS: 124 U/L (ref 38–126)
ALT: 111 U/L — AB (ref 14–54)
AST: 57 U/L — AB (ref 15–41)
Anion gap: 9 (ref 5–15)
BILIRUBIN TOTAL: 1.2 mg/dL (ref 0.3–1.2)
BUN: 12 mg/dL (ref 6–20)
CALCIUM: 7.7 mg/dL — AB (ref 8.9–10.3)
CO2: 18 mmol/L — AB (ref 22–32)
Chloride: 101 mmol/L (ref 101–111)
Creatinine, Ser: 0.83 mg/dL (ref 0.44–1.00)
GFR calc Af Amer: >60 mL/min (ref >60)
GFR calc non Af Amer: >60 mL/min (ref >60)
GLUCOSE: 65 mg/dL (ref 65–99)
Potassium: 4.2 mmol/L (ref 3.5–5.1)
SODIUM: 128 mmol/L — AB (ref 135–145)
TOTAL PROTEIN: 5.8 g/dL — AB (ref 6.5–8.1)

## 2017-03-20 LAB — CBC
HEMATOCRIT: 38.4 % (ref 36.0–46.0)
HEMOGLOBIN: 12.8 g/dL (ref 12.0–15.0)
MCH: 32 pg (ref 26.0–34.0)
MCHC: 33.3 g/dL (ref 30.0–36.0)
MCV: 96 fL (ref 78.0–100.0)
Platelets: 254 10*3/uL (ref 150–400)
RBC: 4 MIL/uL (ref 3.87–5.11)
RDW: 14.2 % (ref 11.5–15.5)
WBC: 13.5 10*3/uL — AB (ref 4.0–10.5)

## 2017-03-20 LAB — HEPATITIS PANEL, ACUTE
HEP A IGM: NEGATIVE
Hep B C IgM: NEGATIVE
Hepatitis B Surface Ag: NEGATIVE

## 2017-03-20 LAB — LIPASE, BLOOD: Lipase: 168 U/L — ABNORMAL HIGH (ref 11–51)

## 2017-03-20 MED ORDER — HYDROCODONE-ACETAMINOPHEN 5-325 MG PO TABS
1.0000 | ORAL_TABLET | Freq: Four times a day (QID) | ORAL | Status: AC | PRN
  Administered 2017-03-20 – 2017-03-21 (×2): 2 via ORAL
  Filled 2017-03-20 (×2): qty 2

## 2017-03-20 MED ORDER — SODIUM CHLORIDE 0.9 % IV SOLN
INTRAVENOUS | Status: DC
  Administered 2017-03-20 – 2017-03-21 (×3): via INTRAVENOUS

## 2017-03-20 NOTE — Progress Notes (Addendum)
PROGRESS NOTE    JUPITER BOYS  ZDG:[id number] DOB: Feb 13, 1956 DOA: 03/18/2017 PCP: Patient, No Pcp Per   Brief Narrative: 61 y.o.femalewith hx of HTN, cholecystectomy presented to [facility] ED today with SSCP and abd pain with diaphoresis and frequent N/V , onset earlier today somewhat acute. ED eval showed ^LFT"s/ tbili, and lipase of 87,000. CT abd showed acute pancreatitis w/o necrosis/ pseudocyst, dilated CBD to 14 mm, abrupt tapering of distal CBD just above the ampulla, no radiopaque stones noted. Also had low density lesion in the uncinate process , 7mm, MRI recommended in 6 mos or sooner if needed. Patient was transferred to [facility] hospital for admission.   Pt still having significant abd pain and chest pain. No active N/V. She rec'd multiple IV doses of pain meds and nausea medications in the ED at [facility]   She has hx of HTN, meds are pending. She describes problems with surgery/ anesthesia which causes her BP to go"out of control" in the past. She had to have neck surgery for "collapsed bones" which were pressing on her spinal cord. No sig sequelae.   12/20-patient reports that she is much better than yesterday but still has a lot of pain she is using morphine PCA.  She denies any nausea vomiting or diarrhea.  She reports that she is hungry and is willing to try some clears today.  MRI of the abdomenFindings consistent acute pancreatitis. No evidence of pancreatic necrosis or pancreatic ductal dilatation. No organized fluid collections. 2. Common bile duct dilated but there is no obstructing lesion identified. No choledocholithiasis. Patient status post cholecystectomy. 3. Mild hepatic steatosis.  Assessment & Plan:   Principal Problem:   Acute pancreatitis Active Problems:   Common bile duct (CBD) obstruction   Nausea & vomiting   Abdominal pain   Substernal chest pain   Acute renal insufficiency   Dehydration   Essential hypertension   Acute biliary  pancreatitis  Acute pancreatitis possibly secondary to gallstone which has been passed.  Though MRI does not show any evidence of gallstones stricture or growth.  Continue morphine PCA for today I have told her that I will DC the morphine PCA tomorrow increase her activity get PT evaluation tomorrow.  Clear liquids for today.  WBC count mildly elevated today compared to yesterday.  She is on no antibiotics at this time we will continue to monitor.  Hyponatremia-change ivf to ns.  DVT prophylaxis: SCD Code Status: Full code Family Communication: Discussed with patient  Disposition Plan:  TBD Consultants: [facility]  Procedures: None Antimicrobials: None  Subjective: Feels better but still has lot of abdominal pain  Objective: Resting in bed in no acute distress Vitals:   03/20/17 0400 03/20/17 0402 03/20/17 0736 03/20/17 1207  BP:  133/86    Pulse:  91    Resp: 15 18 (!) 35 18  Temp:  98.4 F (36.9 C)    TempSrc:  Oral    SpO2: 98% 96% 95% 96%  Weight:  83.9 kg (185 lb)    Height:        Intake/Output Summary (Last 24 hours) at 03/20/2017 1251 Last data filed at 03/20/2017 0852 Gross per 24 hour  Intake 2730 ml  Output 825 ml  Net 1905 ml   Filed Weights   03/19/17 1657 03/20/17 0402  Weight: 83.9 kg (185 lb) 83.9 kg (185 lb)    Examination:  General exam: Appears calm and comfortable  Respiratory system: Clear to auscultation. Respiratory effort normal. Cardiovascular system:  S1 & S2 heard, RRR. No JVD, murmurs, rubs, gallops or clicks. No pedal edema. Gastrointestinal system: Abdomen is nondistended, soft and tender. No organomegaly or masses felt. Normal bowel sounds heard. Central nervous system: Alert and oriented. No focal neurological deficits. Extremities: Symmetric 5 x 5 power. Skin: No rashes, lesions or ulcers Psychiatry: Judgement and insight appear normal. Mood & affect appropriate.     Data Reviewed: I have personally reviewed following labs and  imaging studies  CBC: Recent Labs  Lab 03/18/17 2106 03/19/17 0050 03/19/17 2036 03/20/17 0533  WBC 9.6 10.8* 11.6* 13.5*  NEUTROABS 8.2*  --   --   --   HGB 15.3* 14.9 13.2 12.8  HCT 45.0 44.3 40.3 38.4  MCV 95.1 95.3 96.9 96.0  PLT 346 327 285 254   Basic Metabolic Panel: Recent Labs  Lab 03/18/17 2106 03/19/17 0050 03/19/17 2036 03/20/17 0533  NA 136 135  --  128*  K 4.9 4.6  --  4.2  CL 104 103  --  101  CO2 23 23  --  18*  GLUCOSE 125* 102*  --  65  BUN 16 16  --  12  CREATININE 1.11* 1.01* 0.92 0.83  CALCIUM 8.7* 8.2*  --  7.7*  MG 1.9  --   --   --   PHOS 4.0  --   --   --    GFR: Estimated Creatinine Clearance: 81.8 mL/min (by C-G formula based on SCr of 0.83 mg/dL). Liver Function Tests: Recent Labs  Lab 03/18/17 2106 03/19/17 0050 03/20/17 0533  AST 193* 164* 57*  ALT 261* 239* 111*  ALKPHOS 195* 188* 124  BILITOT 1.4* 1.2 1.2  PROT 6.8 6.6 5.8*  ALBUMIN 3.6 3.6 2.8*   Recent Labs  Lab 03/18/17 2106 03/19/17 0050 03/20/17 0533  LIPASE 1,765* 1,468* 168*   No results for input(s): AMMONIA in the last 168 hours. Coagulation Profile: Recent Labs  Lab 03/19/17 0050  INR 0.93   Cardiac Enzymes: Recent Labs  Lab 03/19/17 0903 03/19/17 1439 03/19/17 2036  TROPONINI <0.03 <0.03 <0.03   BNP (last 3 results) No results for input(s): PROBNP in the last 8760 hours. HbA1C: No results for input(s): HGBA1C in the last 72 hours. CBG: No results for input(s): GLUCAP in the last 168 hours. Lipid Profile: Recent Labs    03/19/17 0903  CHOL 257*  HDL 85  LDLCALC 156*  TRIG 78  CHOLHDL 3.0   Thyroid Function Tests: No results for input(s): TSH, T4TOTAL, FREET4, T3FREE, THYROIDAB in the last 72 hours. Anemia Panel: No results for input(s): VITAMINB12, FOLATE, FERRITIN, TIBC, IRON, RETICCTPCT in the last 72 hours. Sepsis Labs: No results for input(s): PROCALCITON, LATICACIDVEN in the last 168 hours.  No results found for this or any  previous visit (from the past 240 hour(s)).       Radiology Studies: Mr 3d Recon At Scanner  Result Date: 03/20/2017 CLINICAL DATA:  Pancreatitis, acute hx of HTN, cholecystectomy presented to [facility] ED today with SSCP and abd pain with diaphoresis and frequent N/V , onset earlier today somewhat acute. ED eval showed LFT"s/ tbili, and lipase of 87,000. CT.*comment was truncated*^81mL MULTIHANCE GADOBENATE DIMEGLUMINE 529 MG/ML IV SOLN EXAM: MRI ABDOMEN WITHOUT AND WITH CONTRAST (INCLUDING MRCP) TECHNIQUE: Multiplanar multisequence MR imaging of the abdomen was performed both before and after the administration of intravenous contrast. Heavily T2-weighted images of the biliary and pancreatic ducts were obtained, and three-dimensional MRCP images were rendered by post processing. CONTRAST:  17mL MULTIHANCE GADOBENATE DIMEGLUMINE 529 MG/ML IV SOLN COMPARISON:  CT 03/18/2017 FINDINGS: Lower chest:  Bilateral pleural effusions. Hepatobiliary: No significant intrahepatic duct dilatation. Common bile duct is dilated to 11 mm. There is no filling defect the common bile duct. No external compression. Dilatation extends to the ampulla. No focal hepatic lesion present. Loss signal intensity on the opposed phase imaging consistent mild hepatic steatosis (series 7). Pancreas: Deep pancreas is mildly edematous. There is no pancreatic duct dilatation. The pancreatic duct is continuous without evidence of interruption. There is a robust inflammatory reaction with extensive fluid along the body of pancreas extending along the LEFT and RIGHT pericolic gutter and anterior pararenal space. Postcontrast imaging demonstrates uniform enhancement of the pancreatic parenchyma without evidence necrosis. No organized fluid collections. Spleen: Normal spleen. Adrenals/urinary tract: Adrenal glands and kidneys are normal. Stomach/Bowel: Stomach and limited of the small bowel is unremarkable Vascular/Lymphatic: Abdominal aortic  normal caliber. No evidence of arterial vascular complication associated pancreatitis. Musculoskeletal: No aggressive osseous lesion IMPRESSION: 1. Findings consistent acute pancreatitis. No evidence of pancreatic necrosis or pancreatic ductal dilatation. No organized fluid collections. 2. Common bile duct dilated but there is no obstructing lesion identified. No choledocholithiasis. Patient status post cholecystectomy. 3. Mild hepatic steatosis. Electronically Signed   By: Genevive BiStewart  Edmunds M.D.   On: 03/20/2017 08:39   Mr Abdomen Mrcp Vivien RossettiW Wo Contast  Result Date: 03/20/2017 CLINICAL DATA:  Pancreatitis, acute hx of HTN, cholecystectomy [facility] today with SSCP and abd pain with diaphoresis and frequent N/V , onset earlier today somewhat acute. ED eval showed LFT"s/ tbili, and lipase of 87,000. CT.*comment was truncated*^2617mL MULTIHANCE GADOBENATE DIMEGLUMINE 529 MG/ML IV SOLN EXAM: MRI ABDOMEN WITHOUT AND WITH CONTRAST (INCLUDING MRCP) TECHNIQUE: Multiplanar multisequence MR imaging of the abdomen was performed both before and after the administration of intravenous contrast. Heavily T2-weighted images of the biliary and pancreatic ducts were obtained, and three-dimensional MRCP images were rendered by post processing. CONTRAST:  17mL MULTIHANCE GADOBENATE DIMEGLUMINE 529 MG/ML IV SOLN COMPARISON:  CT 03/18/2017 FINDINGS: Lower chest:  Bilateral pleural effusions. Hepatobiliary: No significant intrahepatic duct dilatation. Common bile duct is dilated to 11 mm. There is no filling defect the common bile duct. No external compression. Dilatation extends to the ampulla. No focal hepatic lesion present. Loss signal intensity on the opposed phase imaging consistent mild hepatic steatosis (series 7). Pancreas: Deep pancreas is mildly edematous. There is no pancreatic duct dilatation. The pancreatic duct is continuous without evidence of interruption. There is a robust inflammatory reaction with  extensive fluid along the body of pancreas extending along the LEFT and RIGHT pericolic gutter and anterior pararenal space. Postcontrast imaging demonstrates uniform enhancement of the pancreatic parenchyma without evidence necrosis. No organized fluid collections. Spleen: Normal spleen. Adrenals/urinary tract: Adrenal glands and kidneys are normal. Stomach/Bowel: Stomach and limited of the small bowel is unremarkable Vascular/Lymphatic: Abdominal aortic normal caliber. No evidence of arterial vascular complication associated pancreatitis. Musculoskeletal: No aggressive osseous lesion IMPRESSION: 1. Findings consistent acute pancreatitis. No evidence of pancreatic necrosis or pancreatic ductal dilatation. No organized fluid collections. 2. Common bile duct dilated but there is no obstructing lesion identified. No choledocholithiasis. Patient status post cholecystectomy. 3. Mild hepatic steatosis. Electronically Signed   By: Genevive BiStewart  Edmunds M.D.   On: 03/20/2017 08:39        Scheduled Meds: . enoxaparin (LOVENOX) injection  40 mg Subcutaneous Q24H  . morphine   Intravenous Q4H   Continuous Infusions: . sodium chloride 100 mL/hr at  03/20/17 0825     LOS: 2 days        Alwyn RenElizabeth G Kahley Leib, MD [facility] If 7PM-7AM, please contact night-coverage [email or web address] Password [email or web address] 03/20/2017, 12:51 PM

## 2017-03-20 NOTE — Progress Notes (Signed)
4mL of IV morphine wasted from PCA syringe, witnessed by Thea SilversmithMacKenzie, Charity fundraiserN.

## 2017-03-20 NOTE — Progress Notes (Signed)
Jody KinnierBeverly L Rodriguez 11:33 AM  Subjective: Patient is doing about the same but no new complaints and she had her gallbladder out a year or 2 ago and [place] and is not aware of a endoscopic procedure around that time and we discussed her MRI and she does not have any vomiting but has not passed any gas and her hospital computer chart was reviewed and her case discussed with my partner Dr. Randa Rodriguez  Objective: Vital signs stable afebrile abdominal exam is still fairly tender no rebound decreased bowel sounds minimal guarding labs improved except white count MRI negative for CBD stones but confirms pancreatitis  Assessment: Probable gallstone pancreatitis  Plan: Will try to get operation report from her primary doctor just to see if and Intra-Op cholangiogram was done and will allow 2 item clear liquids and will consider adding pancreatic enzymes and will need to increase activity soon  [facility] WichitaMAGOD,Jody Rodriguez  Pager [phone] After 5PM or if no answer call [phone]

## 2017-03-20 NOTE — Progress Notes (Signed)
1.77 mL of IV dilaudid wasted from old PCA syringe, witnessed by Elijah Birkom, Charity fundraiserN.

## 2017-03-21 LAB — COMPREHENSIVE METABOLIC PANEL
ALBUMIN: 2.5 g/dL — AB (ref 3.5–5.0)
ALK PHOS: 113 U/L (ref 38–126)
ALT: 73 U/L — ABNORMAL HIGH (ref 14–54)
ANION GAP: 9 (ref 5–15)
AST: 33 U/L (ref 15–41)
BUN: 9 mg/dL (ref 6–20)
CALCIUM: 8.2 mg/dL — AB (ref 8.9–10.3)
CHLORIDE: 100 mmol/L — AB (ref 101–111)
CO2: 23 mmol/L (ref 22–32)
Creatinine, Ser: 0.68 mg/dL (ref 0.44–1.00)
GFR calc Af Amer: >60 mL/min (ref >60)
GFR calc non Af Amer: >60 mL/min (ref >60)
GLUCOSE: 78 mg/dL (ref 65–99)
Potassium: 4 mmol/L (ref 3.5–5.1)
SODIUM: 132 mmol/L — AB (ref 135–145)
Total Bilirubin: 1.2 mg/dL (ref 0.3–1.2)
Total Protein: 5.9 g/dL — ABNORMAL LOW (ref 6.5–8.1)

## 2017-03-21 LAB — CBC WITH DIFFERENTIAL/PLATELET
BASOS PCT: 0 %
Basophils Absolute: 0 10*3/uL (ref 0.0–0.1)
Eosinophils Absolute: 0.1 10*3/uL (ref 0.0–0.7)
Eosinophils Relative: 1 %
HEMATOCRIT: 38.2 % (ref 36.0–46.0)
HEMOGLOBIN: 13 g/dL (ref 12.0–15.0)
LYMPHS ABS: 1.1 10*3/uL (ref 0.7–4.0)
LYMPHS PCT: 9 %
MCH: 32.6 pg (ref 26.0–34.0)
MCHC: 34 g/dL (ref 30.0–36.0)
MCV: 95.7 fL (ref 78.0–100.0)
MONO ABS: 1 10*3/uL (ref 0.1–1.0)
MONOS PCT: 7 %
NEUTROS ABS: 11.1 10*3/uL — AB (ref 1.7–7.7)
NEUTROS PCT: 83 %
Platelets: 260 10*3/uL (ref 150–400)
RBC: 3.99 MIL/uL (ref 3.87–5.11)
RDW: 14 % (ref 11.5–15.5)
WBC: 13.3 10*3/uL — ABNORMAL HIGH (ref 4.0–10.5)

## 2017-03-21 LAB — LIPASE, BLOOD: Lipase: 46 U/L (ref 11–51)

## 2017-03-21 MED ORDER — HYDROMORPHONE HCL 1 MG/ML IJ SOLN
1.0000 mg | INTRAMUSCULAR | Status: DC | PRN
  Administered 2017-03-21 – 2017-03-22 (×6): 1 mg via INTRAVENOUS
  Filled 2017-03-21 (×6): qty 1

## 2017-03-21 MED ORDER — ACETAMINOPHEN 325 MG PO TABS
650.0000 mg | ORAL_TABLET | Freq: Four times a day (QID) | ORAL | Status: DC | PRN
  Administered 2017-03-21: 650 mg via ORAL
  Filled 2017-03-21: qty 2

## 2017-03-21 NOTE — Progress Notes (Signed)
[facility] PROGRESS NOTE Subjective Patient feels better with less pain.  She is starting to get up to move around the room.  She still has not had a bowel movement or pass gas.  Objective: Vital signs in last 24 hours: Temp:  [98.4 F (36.9 C)-99.5 F (37.5 C)] 98.4 F (36.9 C) (12/21 0515) Pulse Rate:  [91-100] 91 (12/21 0515) Resp:  [13-20] 16 (12/21 0732) BP: (131-146)/(75-85) 141/75 (12/21 0515) SpO2:  [90 %-97 %] 92 % (12/21 0732) Last BM Date: 03/17/17  Intake/Output from previous day: 12/20 0701 - 12/21 0700 In: 778.3 [P.O.:120; I.V.:658.3] Out: 1625 [Urine:1625] Intake/Output this shift: Total I/O In: 240 [P.O.:240] Out: -   PE: General--much more alert due to less pain medications  Abdomen--nondistended still fairly tender no bowel sounds heard  Lab Results: Recent Labs    03/18/17 2106 03/19/17 0050 03/19/17 2036 03/20/17 0533 03/21/17 0620  WBC 9.6 10.8* 11.6* 13.5* 13.3*  HGB 15.3* 14.9 13.2 12.8 13.0  HCT 45.0 44.3 40.3 38.4 38.2  PLT 346 327 285 254 260   BMET Recent Labs    03/18/17 2106 03/19/17 0050 03/19/17 2036 03/20/17 0533 03/21/17 0620  NA 136 135  --  128* 132*  K 4.9 4.6  --  4.2 4.0  CL 104 103  --  101 100*  CO2 23 23  --  18* 23  CREATININE 1.11* 1.01* 0.92 0.83 0.68   LFT Recent Labs    03/19/17 0050 03/20/17 0533 03/21/17 0620  PROT 6.6 5.8* 5.9*  AST 164* 57* 33  ALT 239* 111* 73*  ALKPHOS 188* 124 113  BILITOT 1.2 1.2 1.2   PT/INR Recent Labs    03/19/17 0050  LABPROT 12.3  INR 0.93   PANCREAS Recent Labs    03/19/17 0050 03/20/17 0533 03/21/17 0620  LIPASE 1,468* 168* 46         Studies/Results: Mr 3d Recon At Scanner  Result Date: 03/20/2017 CLINICAL DATA:  Pancreatitis, acute hx of HTN, [facility] with SSCP and abd pain with diaphoresis and frequent N/V , onset earlier today somewhat acute. ED eval showed LFT"s/ tbili, and lipase of 87,000.  CT.*comment was truncated*^9317mL MULTIHANCE GADOBENATE DIMEGLUMINE 529 MG/ML IV SOLN EXAM: MRI ABDOMEN WITHOUT AND WITH CONTRAST (INCLUDING MRCP) TECHNIQUE: Multiplanar multisequence MR imaging of the abdomen was performed both before and after the administration of intravenous contrast. Heavily T2-weighted images of the biliary and pancreatic ducts were obtained, and three-dimensional MRCP images were rendered by post processing. CONTRAST:  17mL MULTIHANCE GADOBENATE DIMEGLUMINE 529 MG/ML IV SOLN COMPARISON:  CT 03/18/2017 FINDINGS: Lower chest:  Bilateral pleural effusions. Hepatobiliary: No significant intrahepatic duct dilatation. Common bile duct is dilated to 11 mm. There is no filling defect the common bile duct. No external compression. Dilatation extends to the ampulla. No focal hepatic lesion present. Loss signal intensity on the opposed phase imaging consistent mild hepatic steatosis (series 7). Pancreas: Deep pancreas is mildly edematous. There is no pancreatic duct dilatation. The pancreatic duct is continuous without evidence of interruption. There is a robust inflammatory reaction with extensive fluid along the body of pancreas extending along the LEFT and RIGHT pericolic gutter and anterior pararenal space. Postcontrast imaging demonstrates uniform enhancement of the pancreatic parenchyma without evidence necrosis. No organized fluid collections. Spleen: Normal spleen. Adrenals/urinary tract: Adrenal glands and kidneys are normal. Stomach/Bowel: Stomach and limited of the small bowel is unremarkable Vascular/Lymphatic: Abdominal aortic normal caliber. No evidence of arterial vascular complication associated  pancreatitis. Musculoskeletal: No aggressive osseous lesion IMPRESSION: 1. Findings consistent acute pancreatitis. No evidence of pancreatic necrosis or pancreatic ductal dilatation. No organized fluid collections. 2. Common bile duct dilated but there is no obstructing lesion identified. No  choledocholithiasis. Patient status post cholecystectomy. 3. Mild hepatic steatosis. Electronically Signed   By: Genevive BiStewart  Edmunds M.D.   On: 03/20/2017 08:39   Mr Abdomen Mrcp Vivien RossettiW Wo Contast  Result Date: 03/20/2017 CLINICAL DATA:  Pancreatitis, acute hx of HTN, [facility]cyst[facility] with SSCP and abd pain with diaphoresis and frequent N/V , onset earlier today somewhat acute. ED eval showed LFT"s/ tbili, and lipase of 87,000. CT.*comment was truncated*^4917mL MULTIHANCE GADOBENATE DIMEGLUMINE 529 MG/ML IV SOLN EXAM: MRI ABDOMEN WITHOUT AND WITH CONTRAST (INCLUDING MRCP) TECHNIQUE: Multiplanar multisequence MR imaging of the abdomen was performed both before and after the administration of intravenous contrast. Heavily T2-weighted images of the biliary and pancreatic ducts were obtained, and three-dimensional MRCP images were rendered by post processing. CONTRAST:  17mL MULTIHANCE GADOBENATE DIMEGLUMINE 529 MG/ML IV SOLN COMPARISON:  CT 03/18/2017 FINDINGS: Lower chest:  Bilateral pleural effusions. Hepatobiliary: No significant intrahepatic duct dilatation. Common bile duct is dilated to 11 mm. There is no filling defect the common bile duct. No external compression. Dilatation extends to the ampulla. No focal hepatic lesion present. Loss signal intensity on the opposed phase imaging consistent mild hepatic steatosis (series 7). Pancreas: Deep pancreas is mildly edematous. There is no pancreatic duct dilatation. The pancreatic duct is continuous without evidence of interruption. There is a robust inflammatory reaction with extensive fluid along the body of pancreas extending along the LEFT and RIGHT pericolic gutter and anterior pararenal space. Postcontrast imaging demonstrates uniform enhancement of the pancreatic parenchyma without evidence necrosis. No organized fluid collections. Spleen: Normal spleen. Adrenals/urinary tract: Adrenal glands and kidneys are normal. Stomach/Bowel:  Stomach and limited of the small bowel is unremarkable Vascular/Lymphatic: Abdominal aortic normal caliber. No evidence of arterial vascular complication associated pancreatitis. Musculoskeletal: No aggressive osseous lesion IMPRESSION: 1. Findings consistent acute pancreatitis. No evidence of pancreatic necrosis or pancreatic ductal dilatation. No organized fluid collections. 2. Common bile duct dilated but there is no obstructing lesion identified. No choledocholithiasis. Patient status post cholecystectomy. 3. Mild hepatic steatosis. Electronically Signed   By: Genevive BiStewart  Edmunds M.D.   On: 03/20/2017 08:39    Medications: I have reviewed the patient's current medications.  Assessment:   1.  Gallstone pancreatitis.  MRCP did not show any retained stones and LFTs are essentially normal.  Lipase has improved to normal but patient still significantly tender without bowel sounds.  Would not push advancing her diet too quickly given the severity of her pancreatitis   Plan: Would continue on sips of ginger ale today and if she begins to pass air or develop bowel sounds I would go ahead and advance her to FAT FREE clear liquids tomorrow.  Should she develop any further symptoms I would repeat the CT scan but see no reason to do that as long as she is improving.  Our service will check her in the morning.   Tresea MallJames L Courtnay Petrilla 03/21/2017, 10:18 AM  This note was created using voice recognition software. Minor errors may Have occurred unintentionally.  Pager: [phone] If no answer or after hours call [phone]

## 2017-03-21 NOTE — Progress Notes (Addendum)
PROGRESS NOTE    Jody Rodriguez  WUJ:[id number] DOB: 12/26/1955 DOA: 03/18/2017 PCP: Patient, No Pcp Per Brief Narrative: 61 y.o.femalewith hx of HTN, cholecystectomy [facility] with SSCP and abd pain with diaphoresis and frequent N/V , onset earlier today somewhat acute. ED eval showed ^LFT"s/ tbili, and lipase of 87,000. CT abd showed acute pancreatitis w/o necrosis/ pseudocyst, dilated CBD to 14 mm, abrupt tapering of distal CBD just above the ampulla, no radiopaque stones noted. Also had low density lesion in the uncinate process , 7mm, MRI recommended in 6 mos or sooner if needed. [facility]   Pt still having significant abd pain and chest pain. No active N/V. She rec'd multiple IV doses of pain meds and nausea medications in [facility].   She has hx of HTN, meds are pending. She describes problems with surgery/ anesthesia which causes her BP to go"out of control" in the past. She had to have neck surgery for "collapsed bones" which were pressing on her spinal cord. No sig sequelae.   12/20-patient reports that she is much better than yesterday but still has a lot of pain she is using morphine PCA.  She denies any nausea vomiting or diarrhea.  She reports that she is hungry and is willing to try some clears today.  MRI of the abdomenFindings consistent acute pancreatitis. No evidence of pancreatic necrosis or pancreatic ductal dilatation. No organized fluid collections. 2. Common bile duct dilated but there is no obstructing lesion identified. No choledocholithiasis. Patient status post cholecystectomy. 3. Mild hepatic steatosis.  12/21-better hasnt had a bm.on clear liquids morphine pca. 12/22-patient reports that she is tolerating the clear liquids okay.  No nausea vomiting but she still has not had a bowel movement.  She reports her abdomen is swollen.  KUB that was done today stat showed she  hasThe gaseous distention of colon which may reflect a mild ileus. No evidence of small bowel obstruction/distension. No concerning mass effect or calcification.  IMPRESSION: Gas distended colon which may reflect mild ileus  Chest x-ray was also done due to the fact that she complained of shortness of breath and she reported her oxygen saturation dropped but I did not get the report from the nursing staff she is placed on 3 L of oxygen at this time.  Her chest x-ray showed Assessment & Plan:Bibasilar opacities with low bilateral lung volumes. Findings likely represent atelectasis. Component of bibasilar pneumonia cannot be excluded. There are likely small bilateral pleural effusions.    Principal Problem:   Acute pancreatitis Active Problems:   Common bile duct (CBD) obstruction   Nausea & vomiting   Abdominal pain   Substernal chest pain   Acute renal insufficiency   Dehydration   Essential hypertension   Acute biliary pancreatitis  Acute pancreatitis presumed to be due to CBD obstruction.  Patient has a history of cholecystectomy in the past.-[facility] where she was apparently  in the ER with extremely high level lipase of in 75,000 range.  She was kept n.p.o. given IV fluids and was put on a morphine PCA pump.  The morphine PCA pump was stopped yesterday and she was started on Dilaudid every 3 as needed.  She was not passing much of flatus or not had a bowel movement.  So she was kept on clear liquids only her liver functions as well as her lipase are getting better in fact her lipase  is normalized today LFTs are trending down nicely.  A KUB was ordered due to bowel distention and that shows gas gaseous colonic distention and ileus.  I have encouraged her to move around and have PT walk HER.  Continue with clears for now follow-up KUB.  Was discussed with Dr. Marca Ancona.  Leukocytosis patient has been gradually increased having increasing her white  count.  There is no real signs of active infection anywhere at this time.  I will check a UA.  It is probably secondary to atelectasis versus acute pancreatitis.  We will follow-up levels tomorrow.  If she has persistently increased WBC count consider starting antibiotics.  Anxiety/oxygen dependency?-Patient was not on oxygen at home she is not a smoker.  However she feels and looks a bit anxious today.  Encouraged her to use incentive spirometry.  Chest x-ray findings noted.Bibasilar opacities with low bilateral lung volumes. Findings likely represent atelectasis. Component of bibasilar pneumonia cannot be excluded. There are likely small bilateral pleural effusions. I will DC her IV fluids and give her a small dose of Lasix x1 dose. Hyponatremia resolved.   DVT prophylaxis:lovenox Code Status:full Family Communication: non family Disposition Plan:tbd  Consultants: gi  Procedures:none Antimicrobials none Subjective:feels better..had a shower yesterday  Objective:resting in bed much more awake, Vitals:   03/21/17 0404 03/21/17 0515 03/21/17 0732 03/21/17 1409  BP:  (!) 141/75  138/81  Pulse:  91  99  Resp: 13 16 16 16   Temp:  98.4 F (36.9 C)  98.4 F (36.9 C)  TempSrc:  Oral  Oral  SpO2: 91% 91% 92% 93%  Weight:      Height:        Intake/Output Summary (Last 24 hours) at 03/21/2017 1547 Last data filed at 03/21/2017 1409 Gross per 24 hour  Intake 240 ml  Output 1625 ml  Net -1385 ml   Filed Weights   03/19/17 1657 03/20/17 0402  Weight: 83.9 kg (185 lb) 83.9 kg (185 lb)    Examination:  General exam: Appears calm and comfortable  Respiratory system: Clear to auscultation. Respiratory effort normal. Cardiovascular system: S1 & S2 heard, RRR. No JVD, murmurs, rubs, gallops or clicks. No pedal edema. Gastrointestinal system: Abdomen is nondistended, soft and  generalizedtender. No organomegaly or masses felt. diminished bowel sounds heard. Central nervous system:  Alert and oriented. No focal neurological deficits. Extremities: Symmetric 5 x 5 power. Skin: No rashes, lesions or ulcers Psychiatry: Judgement and insight appear normal. Mood & affect appropriate.     Data Reviewed: I have personally reviewed following labs and imaging studies  CBC: Recent Labs  Lab 03/18/17 2106 03/19/17 0050 03/19/17 2036 03/20/17 0533 03/21/17 0620  WBC 9.6 10.8* 11.6* 13.5* 13.3*  NEUTROABS 8.2*  --   --   --  11.1*  HGB 15.3* 14.9 13.2 12.8 13.0  HCT 45.0 44.3 40.3 38.4 38.2  MCV 95.1 95.3 96.9 96.0 95.7  PLT 346 327 285 254 260   Basic Metabolic Panel: Recent Labs  Lab 03/18/17 2106 03/19/17 0050 03/19/17 2036 03/20/17 0533 03/21/17 0620  NA 136 135  --  128* 132*  K 4.9 4.6  --  4.2 4.0  CL 104 103  --  101 100*  CO2 23 23  --  18* 23  GLUCOSE 125* 102*  --  65 78  BUN 16 16  --  12 9  CREATININE 1.11* 1.01* 0.92 0.83 0.68  CALCIUM 8.7* 8.2*  --  7.7* 8.2*  MG 1.9  --   --   --   --  PHOS 4.0  --   --   --   --    GFR: Estimated Creatinine Clearance: 84.9 mL/min (by C-G formula based on SCr of 0.68 mg/dL). Liver Function Tests: Recent Labs  Lab 03/18/17 2106 03/19/17 0050 03/20/17 0533 03/21/17 0620  AST 193* 164* 57* 33  ALT 261* 239* 111* 73*  ALKPHOS 195* 188* 124 113  BILITOT 1.4* 1.2 1.2 1.2  PROT 6.8 6.6 5.8* 5.9*  ALBUMIN 3.6 3.6 2.8* 2.5*   Recent Labs  Lab 03/18/17 2106 03/19/17 0050 03/20/17 0533 03/21/17 0620  LIPASE 1,765* 1,468* 168* 46   No results for input(s): AMMONIA in the last 168 hours. Coagulation Profile: Recent Labs  Lab 03/19/17 0050  INR 0.93   Cardiac Enzymes: Recent Labs  Lab 03/19/17 0903 03/19/17 1439 03/19/17 2036  TROPONINI <0.03 <0.03 <0.03   BNP (last 3 results) No results for input(s): PROBNP in the last 8760 hours. HbA1C: No results for input(s): HGBA1C in the last 72 hours. CBG: No results for input(s): GLUCAP in the last 168 hours. Lipid Profile: Recent Labs     03/19/17 0903  CHOL 257*  HDL 85  LDLCALC 156*  TRIG 78  CHOLHDL 3.0   Thyroid Function Tests: No results for input(s): TSH, T4TOTAL, FREET4, T3FREE, THYROIDAB in the last 72 hours. Anemia Panel: No results for input(s): VITAMINB12, FOLATE, FERRITIN, TIBC, IRON, RETICCTPCT in the last 72 hours. Sepsis Labs: No results for input(s): PROCALCITON, LATICACIDVEN in the last 168 hours.  No results found for this or any previous visit (from the past 240 hour(s)).       Radiology Studies: Mr 3d Recon At Scanner  Result Date: 03/20/2017 CLINICAL DATA:  Pancreatitis, acute hx of HTN, cholecystectomy [facility] with SSCP and abd pain with diaphoresis and frequent N/V , onset earlier today somewhat acute. ED eval showed LFT"s/ tbili, and lipase of 87,000. CT.*comment was truncated*^817mL MULTIHANCE GADOBENATE DIMEGLUMINE 529 MG/ML IV SOLN EXAM: MRI ABDOMEN WITHOUT AND WITH CONTRAST (INCLUDING MRCP) TECHNIQUE: Multiplanar multisequence MR imaging of the abdomen was performed both before and after the administration of intravenous contrast. Heavily T2-weighted images of the biliary and pancreatic ducts were obtained, and three-dimensional MRCP images were rendered by post processing. CONTRAST:  17mL MULTIHANCE GADOBENATE DIMEGLUMINE 529 MG/ML IV SOLN COMPARISON:  CT 03/18/2017 FINDINGS: Lower chest:  Bilateral pleural effusions. Hepatobiliary: No significant intrahepatic duct dilatation. Common bile duct is dilated to 11 mm. There is no filling defect the common bile duct. No external compression. Dilatation extends to the ampulla. No focal hepatic lesion present. Loss signal intensity on the opposed phase imaging consistent mild hepatic steatosis (series 7). Pancreas: Deep pancreas is mildly edematous. There is no pancreatic duct dilatation. The pancreatic duct is continuous without evidence of interruption. There is a robust inflammatory reaction with extensive fluid along the body of  pancreas extending along the LEFT and RIGHT pericolic gutter and anterior pararenal space. Postcontrast imaging demonstrates uniform enhancement of the pancreatic parenchyma without evidence necrosis. No organized fluid collections. Spleen: Normal spleen. Adrenals/urinary tract: Adrenal glands and kidneys are normal. Stomach/Bowel: Stomach and limited of the small bowel is unremarkable Vascular/Lymphatic: Abdominal aortic normal caliber. No evidence of arterial vascular complication associated pancreatitis. Musculoskeletal: No aggressive osseous lesion IMPRESSION: 1. Findings consistent acute pancreatitis. No evidence of pancreatic necrosis or pancreatic ductal dilatation. No organized fluid collections. 2. Common bile duct dilated but there is no obstructing lesion identified. No choledocholithiasis. Patient status post cholecystectomy. 3. Mild hepatic steatosis.  Electronically Signed   By: Genevive Bi M.D.   On: 03/20/2017 08:39   Mr Abdomen Mrcp Vivien Rossetti Contast  Result Date: 03/20/2017 CLINICAL DATA:  Pancreatitis, acute hx of HTN, cholecystectomy presented to [facility] ED today with SSCP and abd pain with diaphoresis and frequent N/V , onset earlier today somewhat acute. ED eval showed LFT"s/ tbili, and lipase of 87,000. CT.*comment was truncated*^72mL MULTIHANCE GADOBENATE DIMEGLUMINE 529 MG/ML IV SOLN EXAM: MRI ABDOMEN WITHOUT AND WITH CONTRAST (INCLUDING MRCP) TECHNIQUE: Multiplanar multisequence MR imaging of the abdomen was performed both before and after the administration of intravenous contrast. Heavily T2-weighted images of the biliary and pancreatic ducts were obtained, and three-dimensional MRCP images were rendered by post processing. CONTRAST:  17mL MULTIHANCE GADOBENATE DIMEGLUMINE 529 MG/ML IV SOLN COMPARISON:  CT 03/18/2017 FINDINGS: Lower chest:  Bilateral pleural effusions. Hepatobiliary: No significant intrahepatic duct dilatation. Common bile duct is dilated to 11 mm. There is no  filling defect the common bile duct. No external compression. Dilatation extends to the ampulla. No focal hepatic lesion present. Loss signal intensity on the opposed phase imaging consistent mild hepatic steatosis (series 7). Pancreas: Deep pancreas is mildly edematous. There is no pancreatic duct dilatation. The pancreatic duct is continuous without evidence of interruption. There is a robust inflammatory reaction with extensive fluid along the body of pancreas extending along the LEFT and RIGHT pericolic gutter and anterior pararenal space. Postcontrast imaging demonstrates uniform enhancement of the pancreatic parenchyma without evidence necrosis. No organized fluid collections. Spleen: Normal spleen. Adrenals/urinary tract: Adrenal glands and kidneys are normal. Stomach/Bowel: Stomach and limited of the small bowel is unremarkable Vascular/Lymphatic: Abdominal aortic normal caliber. No evidence of arterial vascular complication associated pancreatitis. Musculoskeletal: No aggressive osseous lesion IMPRESSION: 1. Findings consistent acute pancreatitis. No evidence of pancreatic necrosis or pancreatic ductal dilatation. No organized fluid collections. 2. Common bile duct dilated but there is no obstructing lesion identified. No choledocholithiasis. Patient status post cholecystectomy. 3. Mild hepatic steatosis. Electronically Signed   By: Genevive Bi M.D.   On: 03/20/2017 08:39        Scheduled Meds: Continuous Infusions: . sodium chloride 100 mL/hr at 03/21/17 1523     LOS: 3 days       Alwyn Ren, MD [facility]   If 7PM-7AM, please contact night-coverage [email or web address] Password [facility] 03/21/2017, 3:47 PM

## 2017-03-21 NOTE — Progress Notes (Signed)
Wasted 17mg  of Morphine PCA in med room sink, witnessed by D. Block, Charity fundraiserN.

## 2017-03-21 NOTE — Evaluation (Signed)
Physical Therapy Evaluation Patient Details Name: Jody Rodriguez MRN: [id number] DOB: 1955/09/21 Today's Date: 03/21/2017   History of Present Illness  61 yo female admitted with acute pancreatitis.   Clinical Impression  On eval, pt was Min guard assist for mobility. She walked ~60 feet with a RW. Pt remained on [place] O2-2L for ambulation since sats dropped, on 1 L [place] O2, with pivot to bsc. Increased time to complete all tasks. Discussed d/c plan-pt will return home with husband. She is unsure if she wants HHPT f/u and a RW. She stated she will decide close to d/c. Will follow.     Follow Up Recommendations Home health PT    Equipment Recommendations  Rolling walker with 5" wheels    Recommendations for Other Services       Precautions / Restrictions Precautions Precautions: Fall Precaution Comments: monitor O2 sats Restrictions Weight Bearing Restrictions: No      Mobility  Bed Mobility Overal bed mobility: Needs Assistance Bed Mobility: Supine to Sit     Supine to sit: Min guard;HOB elevated     General bed mobility comments: close guard for safety. Increased time. Pt relied on bedrail  Transfers Overall transfer level: Needs assistance Equipment used: Rolling walker (2 wheeled) Transfers: Sit to/from [employer]Stand;Stand Pivot Transfers Sit to Stand: Min guard Stand pivot transfers: Min guard       General transfer comment: close guard for safety. Increased time. Stand pivot, bed to bsc. Pt relied on armrests of bsc.  Ambulation/Gait Ambulation/Gait assistance: Min guard Ambulation Distance (Feet): 60 Feet Assistive device: Rolling walker (2 wheeled) Gait Pattern/deviations: Step-through pattern;Decreased stride length;Trunk flexed     General Gait Details: very slow gait speed. close guard for safety. Ambulated on 2L [place] O2. Distance limited by nausea, pain  Stairs            Wheelchair Mobility    Modified Rankin (Stroke Patients Only)        Balance Overall balance assessment: Needs assistance           Standing balance-Leahy Scale: Fair                               Pertinent Vitals/Pain Pain Assessment: 0-10 Pain Location: abdomen Pain Descriptors / Indicators: Discomfort;Sore Pain Intervention(s): Monitored during session;Limited activity within patient's tolerance;RN gave pain meds during session    Home Living Family/patient expects to be discharged to:: Private residence Living Arrangements: Spouse/significant other Available Help at Discharge: Family Type of Home: House       Home Layout: Two level;Able to live on main level with bedroom/bathroom Home Equipment: None      Prior Function Level of Independence: Independent               Hand Dominance        Extremity/Trunk Assessment   Upper Extremity Assessment Upper Extremity Assessment: Defer to OT evaluation    Lower Extremity Assessment Lower Extremity Assessment: Generalized weakness    Cervical / Trunk Assessment Cervical / Trunk Assessment: Normal  Communication   Communication: No difficulties  Cognition Arousal/Alertness: Awake/alert Behavior During Therapy: WFL for tasks assessed/performed Overall Cognitive Status: Within Functional Limits for tasks assessed                                        General Comments  Exercises     Assessment/Plan    PT Assessment Patient needs continued PT services  PT Problem List Decreased strength;Decreased balance;Decreased mobility;Pain;Decreased activity tolerance;Decreased knowledge of use of DME       PT Treatment Interventions DME instruction;Gait training;Functional mobility training;Therapeutic activities;Balance training;Patient/family education;Therapeutic exercise    PT Goals (Current goals can be found in the Care Plan section)  Acute Rehab PT Goals Patient Stated Goal: to regain independence. less pain.  PT Goal Formulation:  With patient/family Time For Goal Achievement: 03/28/17 Potential to Achieve Goals: Good    Frequency Min 3X/week   Barriers to discharge        Co-evaluation               AM-PAC PT "6 Clicks" Daily Activity  Outcome Measure Difficulty turning over in bed (including adjusting bedclothes, sheets and blankets)?: A Little Difficulty moving from lying on back to sitting on the side of the bed? : A Little Difficulty sitting down on and standing up from a chair with arms (e.g., wheelchair, bedside commode, etc,.)?: A Little Help needed moving to and from a bed to chair (including a wheelchair)?: A Little Help needed walking in hospital room?: A Little Help needed climbing 3-5 steps with a railing? : A Little 6 Click Score: 18    End of Session Equipment Utilized During Treatment: Oxygen Activity Tolerance: Patient limited by pain(Limited by nausea) Patient left: in chair;with call bell/phone within reach;with family/visitor present   PT Visit Diagnosis: Muscle weakness (generalized) (M62.81);Difficulty in walking, not elsewhere classified (R26.2);Pain Pain - part of body: (abdomen)    Time: [id number] PT Time Calculation (min) (ACUTE ONLY): 27 min   Charges:   PT Evaluation $PT Eval Moderate Complexity: 1 Mod PT Treatments $Gait Training: 8-22 mins   PT G Codes:          Rebeca AlertJannie Rozanna Cormany, MPT Pager: [phone]

## 2017-03-21 NOTE — Progress Notes (Signed)
OT Cancellation Note  Patient Details Name: Jody Rodriguez MRN: [id number] DOB: September 20, 1955   Cancelled Treatment:    Reason Eval/Treat Not Completed: Other (comment).  Pt worked with PT a short while ago and would like to do whatever she can to get better quickly. She does not feel up to OT at this moment.  Will try to return over the weekend.  Kashani,Sy Saintjean 03/21/2017, 3:05 PM  Jody Rodriguez, OTR/L [phone] 03/21/2017

## 2017-03-22 ENCOUNTER — Inpatient Hospital Stay (HOSPITAL_COMMUNITY): Payer: BC Managed Care – PPO

## 2017-03-22 LAB — CBC
HEMATOCRIT: 34.9 % — AB (ref 36.0–46.0)
Hemoglobin: 11.8 g/dL — ABNORMAL LOW (ref 12.0–15.0)
MCH: 31.9 pg (ref 26.0–34.0)
MCHC: 33.8 g/dL (ref 30.0–36.0)
MCV: 94.3 fL (ref 78.0–100.0)
Platelets: 264 10*3/uL (ref 150–400)
RBC: 3.7 MIL/uL — ABNORMAL LOW (ref 3.87–5.11)
RDW: 14 % (ref 11.5–15.5)
WBC: 14.1 10*3/uL — ABNORMAL HIGH (ref 4.0–10.5)

## 2017-03-22 LAB — COMPREHENSIVE METABOLIC PANEL
ALK PHOS: 96 U/L (ref 38–126)
ALT: 53 U/L (ref 14–54)
AST: 25 U/L (ref 15–41)
Albumin: 2.4 g/dL — ABNORMAL LOW (ref 3.5–5.0)
Anion gap: 9 (ref 5–15)
BUN: 7 mg/dL (ref 6–20)
CALCIUM: 8.4 mg/dL — AB (ref 8.9–10.3)
CHLORIDE: 102 mmol/L (ref 101–111)
CO2: 22 mmol/L (ref 22–32)
CREATININE: 0.57 mg/dL (ref 0.44–1.00)
GFR calc Af Amer: >60 mL/min (ref >60)
Glucose, Bld: 91 mg/dL (ref 65–99)
Potassium: 3.8 mmol/L (ref 3.5–5.1)
SODIUM: 133 mmol/L — AB (ref 135–145)
Total Bilirubin: 0.9 mg/dL (ref 0.3–1.2)
Total Protein: 5.8 g/dL — ABNORMAL LOW (ref 6.5–8.1)

## 2017-03-22 LAB — LIPASE, BLOOD: Lipase: 29 U/L (ref 11–51)

## 2017-03-22 MED ORDER — HYDROMORPHONE HCL 1 MG/ML IJ SOLN
1.0000 mg | INTRAMUSCULAR | Status: DC | PRN
  Administered 2017-03-22 – 2017-03-23 (×8): 1 mg via INTRAVENOUS
  Filled 2017-03-22 (×9): qty 1

## 2017-03-22 MED ORDER — HYDROMORPHONE HCL 1 MG/ML IJ SOLN
1.0000 mg | INTRAMUSCULAR | Status: DC | PRN
  Administered 2017-03-22: 1 mg via INTRAVENOUS
  Filled 2017-03-22: qty 1

## 2017-03-22 MED ORDER — FUROSEMIDE 10 MG/ML IJ SOLN
20.0000 mg | Freq: Once | INTRAMUSCULAR | Status: AC
  Administered 2017-03-22: 20 mg via INTRAVENOUS

## 2017-03-22 MED ORDER — FUROSEMIDE 10 MG/ML IJ SOLN
INTRAMUSCULAR | Status: AC
  Filled 2017-03-22: qty 2

## 2017-03-22 NOTE — Evaluation (Signed)
Occupational Therapy Evaluation Patient Details Name: Jody KinnierBeverly L Rodriguez MRN: [id number] DOB: 1955-09-24 Today's Date: 03/22/2017    History of Present Illness 61 yo female admitted with acute pancreatitis.    Clinical Impression   Pt with decline in function and safety with ADLs and ADL mobility with decreased balance and endurance. Pt is limted by abdominal pain. O2 SATs decreased to 86% after walking to bathroom and back for toileting on RA; pt put back on 2.5 L O2 and recovered to 91% < a minute. Pt would benefit from acute OT services to address impairments to maximize level of function and safety    Follow Up Recommendations  No OT follow up;Supervision - Intermittent    Equipment Recommendations  Other (comment)(reacher, LH sponge)    Recommendations for Other Services       Precautions / Restrictions Precautions Precautions: Fall Precaution Comments: monitor O2 sats Restrictions Weight Bearing Restrictions: No      Mobility Bed Mobility Overal bed mobility: Needs Assistance Bed Mobility: Supine to Sit;Sit to Supine     Supine to sit: Supervision Sit to supine: Supervision   General bed mobility comments: sup for safety. Increased time. Pt relied on bedrail  Transfers Overall transfer level: Needs assistance Equipment used: Rolling walker (2 wheeled) Transfers: Sit to/from [employer]Stand;Stand Pivot Transfers Sit to Stand: Min guard Stand pivot transfers: Min guard       General transfer comment: close guard for safety. Increased time. ambulated to bathroom    Balance Overall balance assessment: Needs assistance Sitting-balance support: No upper extremity supported;Feet supported Sitting balance-Leahy Scale: Good     Standing balance support: During functional activity;No upper extremity supported Standing balance-Leahy Scale: Fair                             ADL either performed or assessed with clinical judgement   ADL Overall ADL's : Needs  assistance/impaired Eating/Feeding: Independent;Sitting   Grooming: Wash/dry hands;Wash/dry face;Standing;Min guard   Upper Body Bathing: Set up;Sitting   Lower Body Bathing: Minimal assistance   Upper Body Dressing : Set up;Sitting   Lower Body Dressing: Minimal assistance   Toilet Transfer: Min guard;Ambulation;Regular Toilet;Grab bars   Toileting- Clothing Manipulation and Hygiene: Min guard;Sit to/from stand   Tub/ Shower Transfer: Min guard;Ambulation;Grab bars   Functional mobility during ADLs: Min guard General ADL Comments: educated pt on use of ADL A/E for LB selfcare      Vision Baseline Vision/History: Wears glasses Wears Glasses: Reading only Patient Visual Report: No change from baseline       Perception     Praxis      Pertinent Vitals/Pain Pain Assessment: 0-10 Pain Score: 8  Pain Location: abdomen Pain Descriptors / Indicators: Discomfort;Sore;Heaviness Pain Intervention(s): Limited activity within patient's tolerance;Premedicated before session;Monitored during session;Repositioned;Relaxation     Hand Dominance Right   Extremity/Trunk Assessment Upper Extremity Assessment Upper Extremity Assessment: Defer to OT evaluation   Lower Extremity Assessment Lower Extremity Assessment: Defer to PT evaluation   Cervical / Trunk Assessment Cervical / Trunk Assessment: Normal   Communication Communication Communication: No difficulties   Cognition Arousal/Alertness: Awake/alert Behavior During Therapy: WFL for tasks assessed/performed Overall Cognitive Status: Within Functional Limits for tasks assessed                                     General Comments   pt very  pleasant and cooperative    Exercises     Shoulder Instructions      Home Living Family/patient expects to be discharged to:: Private residence Living Arrangements: Spouse/significant other Available Help at Discharge: Family Type of Home: House       Home  Layout: Two level;Able to live on main level with bedroom/bathroom     Bathroom Shower/Tub: Tub/shower unit;Walk-in shower   Bathroom Toilet: Standard     Home Equipment: None          Prior Functioning/Environment Level of Independence: Independent                 OT Problem List: Decreased activity tolerance;Cardiopulmonary status limiting activity;Decreased knowledge of use of DME or AE;Impaired balance (sitting and/or standing);Pain      OT Treatment/Interventions: Self-care/ADL training;DME and/or AE instruction;Therapeutic activities;Patient/family education    OT Goals(Current goals can be found in the care plan section) Acute Rehab OT Goals Patient Stated Goal: to regain independence. less pain.  OT Goal Formulation: With patient Time For Goal Achievement: 04/05/17 Potential to Achieve Goals: Good ADL Goals Pt Will Perform Grooming: with set-up;with supervision;standing Pt Will Perform Lower Body Bathing: with min guard assist;sit to/from stand;with caregiver independent in assisting;with adaptive equipment Pt Will Perform Lower Body Dressing: with min guard assist;sit to/from stand;with caregiver independent in assisting;with adaptive equipment Pt Will Transfer to Toilet: with supervision;ambulating;regular height toilet Pt Will Perform Toileting - Clothing Manipulation and hygiene: with supervision;sit to/from stand;with caregiver independent in assisting Pt Will Perform Tub/Shower Transfer: with supervision;ambulating;grab bars;with caregiver independent in assisting  OT Frequency: Min 2X/week   Barriers to D/C:    no barriers       Co-evaluation              AM-PAC PT "6 Clicks" Daily Activity     Outcome Measure Help from another person eating meals?: None Help from another person taking care of personal grooming?: A Little Help from another person toileting, which includes using toliet, bedpan, or urinal?: A Little Help from another person  bathing (including washing, rinsing, drying)?: A Little Help from another person to put on and taking off regular upper body clothing?: None Help from another person to put on and taking off regular lower body clothing?: A Little 6 Click Score: 20   End of Session Equipment Utilized During Treatment: Gait belt  Activity Tolerance: Patient limited by fatigue;Patient limited by pain;Other (comment)(O2 SATs decreased to 86% after walking to bathroom and back for toileting on RA; pt put back on 2.5 L O2 and recovered to 91% < a minute) Patient left: in bed;with call bell/phone within reach;Other (comment)(MD in room)  OT Visit Diagnosis: Unsteadiness on feet (R26.81);Pain Pain - Right/Left: (generalized) Pain - part of body: (abdomen)                Time: [id number] OT Time Calculation (min): 27 min Charges:  OT General Charges $OT Visit: 1 Visit OT Evaluation $OT Eval Moderate Complexity: 1 Mod OT Treatments $Therapeutic Activity: 8-22 mins G-Codes: OT G-codes **NOT FOR INPATIENT CLASS** Functional Assessment Tool Used: AM-PAC 6 Clicks Daily Activity     Galen ManilaSpencer, Joyleen Haselton Jeanette 03/22/2017, 12:04 PM

## 2017-03-22 NOTE — Progress Notes (Signed)
Subjective: The patient was seen and examined at bedside. She states pain can be 8 out of 10 in intensity with movement. She denies nausea or vomiting and has had passage of some flatness but no bowel movement since admission. She was mildly short of breath and needed to be on oxygen 2 L via nasal cannula because of desaturation when she went to the restroom.  Objective: Vital signs in last 24 hours: Temp:  [98.1 F (36.7 C)-98.7 F (37.1 C)] 98.1 F (36.7 C) (12/22 0638) Pulse Rate:  [97-101] 97 (12/22 0638) Resp:  [16-19] 19 (12/22 0638) BP: (138-156)/(75-89) 156/89 (12/22 0638) SpO2:  [90 %-95 %] 95 % (12/22 0638) Weight:  [104.2 kg (229 lb 12.8 oz)] 104.2 kg (229 lb 12.8 oz) (12/22 0638) Weight change:  Last BM Date: (PTA)  PE: In mild distress, tearful GENERAL: No icterus, no pallor ABDOMEN: Distended, but not tense, no bowel sounds audible, mild tenderness in epigastrium EXTREMITIES: No edema no deformity  Lab Results: Results for orders placed or performed during the hospital encounter of 03/18/17 (from the past 48 hour(s))  Comprehensive metabolic panel     Status: Abnormal   Collection Time: 03/21/17  6:20 AM  Result Value Ref Range   Sodium 132 (L) 135 - 145 mmol/L   Potassium 4.0 3.5 - 5.1 mmol/L   Chloride 100 (L) 101 - 111 mmol/L   CO2 23 22 - 32 mmol/L   Glucose, Bld 78 65 - 99 mg/dL   BUN 9 6 - 20 mg/dL   Creatinine, Ser 0.68 0.44 - 1.00 mg/dL   Calcium 8.2 (L) 8.9 - 10.3 mg/dL   Total Protein 5.9 (L) 6.5 - 8.1 g/dL   Albumin 2.5 (L) 3.5 - 5.0 g/dL   AST 33 15 - 41 U/L   ALT 73 (H) 14 - 54 U/L   Alkaline Phosphatase 113 38 - 126 U/L   Total Bilirubin 1.2 0.3 - 1.2 mg/dL   GFR calc non Af Amer >60 >60 mL/min   GFR calc Af Amer >60 >60 mL/min    Comment: (NOTE) The eGFR has been calculated using the CKD EPI equation. This calculation has not been validated in all clinical situations. eGFR's persistently <60 mL/min signify possible Chronic Kidney Disease.     Anion gap 9 5 - 15  CBC with Differential/Platelet     Status: Abnormal   Collection Time: 03/21/17  6:20 AM  Result Value Ref Range   WBC 13.3 (H) 4.0 - 10.5 K/uL   RBC 3.99 3.87 - 5.11 MIL/uL   Hemoglobin 13.0 12.0 - 15.0 g/dL   HCT 38.2 36.0 - 46.0 %   MCV 95.7 78.0 - 100.0 fL   MCH 32.6 26.0 - 34.0 pg   MCHC 34.0 30.0 - 36.0 g/dL   RDW 14.0 11.5 - 15.5 %   Platelets 260 150 - 400 K/uL   Neutrophils Relative % 83 %   Neutro Abs 11.1 (H) 1.7 - 7.7 K/uL   Lymphocytes Relative 9 %   Lymphs Abs 1.1 0.7 - 4.0 K/uL   Monocytes Relative 7 %   Monocytes Absolute 1.0 0.1 - 1.0 K/uL   Eosinophils Relative 1 %   Eosinophils Absolute 0.1 0.0 - 0.7 K/uL   Basophils Relative 0 %   Basophils Absolute 0.0 0.0 - 0.1 K/uL  Lipase, blood     Status: None   Collection Time: 03/21/17  6:20 AM  Result Value Ref Range   Lipase 46 11 - 51 U/L    Comprehensive metabolic panel     Status: Abnormal   Collection Time: 03/22/17  7:38 AM  Result Value Ref Range   Sodium 133 (L) 135 - 145 mmol/L   Potassium 3.8 3.5 - 5.1 mmol/L   Chloride 102 101 - 111 mmol/L   CO2 22 22 - 32 mmol/L   Glucose, Bld 91 65 - 99 mg/dL   BUN 7 6 - 20 mg/dL   Creatinine, Ser 0.57 0.44 - 1.00 mg/dL   Calcium 8.4 (L) 8.9 - 10.3 mg/dL   Total Protein 5.8 (L) 6.5 - 8.1 g/dL   Albumin 2.4 (L) 3.5 - 5.0 g/dL   AST 25 15 - 41 U/L   ALT 53 14 - 54 U/L   Alkaline Phosphatase 96 38 - 126 U/L   Total Bilirubin 0.9 0.3 - 1.2 mg/dL   GFR calc non Af Amer >60 >60 mL/min   GFR calc Af Amer >60 >60 mL/min    Comment: (NOTE) The eGFR has been calculated using the CKD EPI equation. This calculation has not been validated in all clinical situations. eGFR's persistently <60 mL/min signify possible Chronic Kidney Disease.    Anion gap 9 5 - 15  Lipase, blood     Status: None   Collection Time: 03/22/17  7:38 AM  Result Value Ref Range   Lipase 29 11 - 51 U/L  CBC     Status: Abnormal   Collection Time: 03/22/17  7:38 AM  Result  Value Ref Range   WBC 14.1 (H) 4.0 - 10.5 K/uL   RBC 3.70 (L) 3.87 - 5.11 MIL/uL   Hemoglobin 11.8 (L) 12.0 - 15.0 g/dL   HCT 34.9 (L) 36.0 - 46.0 %   MCV 94.3 78.0 - 100.0 fL   MCH 31.9 26.0 - 34.0 pg   MCHC 33.8 30.0 - 36.0 g/dL   RDW 14.0 11.5 - 15.5 %   Platelets 264 150 - 400 K/uL    Studies/Results: Dg Abd 1 View  Result Date: 03/22/2017 CLINICAL DATA:  Abdominal pain due to pancreatitis EXAM: ABDOMEN - 1 VIEW COMPARISON:  CT from 4 days ago FINDINGS: The gaseous distention of colon which may reflect a mild ileus. No evidence of small bowel obstruction/distension. No concerning mass effect or calcification. IMPRESSION: Gas distended colon which may reflect mild ileus. Electronically Signed   By: Jonathon  Watts M.D.   On: 03/22/2017 07:55    Medications: I have reviewed the patient's current medications.  Assessment: Gallstone pancreatitis, normal liver enzymes, leukocytosis, mild normocytic anemia, normal renal function, albumin Mild ileus with gaseous distention of colon, no evidence of small bowel obstruction or distention  Plan: Patient on clear liquid diet, receiving Dilaudid 1 mg every 4 hours when necessary, pain not adequately controlled, will increase Dilaudid to every 3 hours as needed.  Continued on normal saline at 100 mL an hour, appears tachypneic and has mild hypoxia now on oxygen via nasal cannula? Fluid overload, we will get a chest x-ray stat, if evidence of fluid overload noted IV fluid may need to be reduced or discontinued.  Encouraged early mobilization, out of bed to chair, advance diet only when abdominal pain is adequately controlled, also patient does not want her diet to be advanced due to abdominal pain.   Arya Karki 03/22/2017, 11:09 AM   Pager [phone] If no answer or after 5 PM call [phone] 

## 2017-03-23 ENCOUNTER — Inpatient Hospital Stay (HOSPITAL_COMMUNITY): Payer: BC Managed Care – PPO

## 2017-03-23 DIAGNOSIS — K831 Obstruction of bile duct: Secondary | ICD-10-CM

## 2017-03-23 DIAGNOSIS — K851 Biliary acute pancreatitis without necrosis or infection: Principal | ICD-10-CM

## 2017-03-23 LAB — COMPREHENSIVE METABOLIC PANEL
ALBUMIN: 2.5 g/dL — AB (ref 3.5–5.0)
ALT: 41 U/L (ref 14–54)
ANION GAP: 13 (ref 5–15)
AST: 24 U/L (ref 15–41)
Alkaline Phosphatase: 103 U/L (ref 38–126)
BILIRUBIN TOTAL: 0.8 mg/dL (ref 0.3–1.2)
BUN: 7 mg/dL (ref 6–20)
CO2: 22 mmol/L (ref 22–32)
Calcium: 8.5 mg/dL — ABNORMAL LOW (ref 8.9–10.3)
Chloride: 100 mmol/L — ABNORMAL LOW (ref 101–111)
Creatinine, Ser: 0.69 mg/dL (ref 0.44–1.00)
GFR calc non Af Amer: >60 mL/min (ref >60)
GLUCOSE: 97 mg/dL (ref 65–99)
POTASSIUM: 3.2 mmol/L — AB (ref 3.5–5.1)
Sodium: 135 mmol/L (ref 135–145)
TOTAL PROTEIN: 5.9 g/dL — AB (ref 6.5–8.1)

## 2017-03-23 LAB — CBC
HEMATOCRIT: 34.9 % — AB (ref 36.0–46.0)
Hemoglobin: 11.7 g/dL — ABNORMAL LOW (ref 12.0–15.0)
MCH: 31.4 pg (ref 26.0–34.0)
MCHC: 33.5 g/dL (ref 30.0–36.0)
MCV: 93.6 fL (ref 78.0–100.0)
Platelets: 312 10*3/uL (ref 150–400)
RBC: 3.73 MIL/uL — ABNORMAL LOW (ref 3.87–5.11)
RDW: 13.7 % (ref 11.5–15.5)
WBC: 13.8 10*3/uL — ABNORMAL HIGH (ref 4.0–10.5)

## 2017-03-23 LAB — LIPASE, BLOOD: LIPASE: 25 U/L (ref 11–51)

## 2017-03-23 MED ORDER — FUROSEMIDE 10 MG/ML IJ SOLN
20.0000 mg | Freq: Two times a day (BID) | INTRAMUSCULAR | Status: DC
  Administered 2017-03-23 – 2017-03-24 (×2): 20 mg via INTRAVENOUS
  Filled 2017-03-23 (×2): qty 2

## 2017-03-23 MED ORDER — POTASSIUM CHLORIDE CRYS ER 20 MEQ PO TBCR
40.0000 meq | EXTENDED_RELEASE_TABLET | ORAL | Status: AC
  Administered 2017-03-23 (×2): 40 meq via ORAL
  Filled 2017-03-23 (×2): qty 2

## 2017-03-23 MED ORDER — OXYCODONE HCL 5 MG PO TABS
5.0000 mg | ORAL_TABLET | ORAL | Status: DC | PRN
  Administered 2017-03-23 – 2017-03-24 (×5): 5 mg via ORAL
  Filled 2017-03-23 (×5): qty 1

## 2017-03-23 NOTE — Progress Notes (Signed)
PROGRESS NOTE    Jody Rodriguez  ZOX:[id number] DOB: 02/22/1956 DOA: 03/18/2017 PCP: Patient, No Pcp Per Brief Narrative: 61 y.o.femalewith hx of HTN, cholecystectomy [facility] with SSCP and abd pain with diaphoresis and frequent N/V , onset earlier today somewhat acute. ED eval showed ^LFT"s/ tbili, and lipase of 87,000. CT abd showed acute pancreatitis w/o necrosis/ pseudocyst, dilated CBD to 14 mm, abrupt tapering of distal CBD just above the ampulla, no radiopaque stones noted. Also had low density lesion in the uncinate process , 7mm, MRI recommended in 6 mos or sooner if needed. [facility].   Pt still having significant abd pain and chest pain. No active N/V. She rec'd multiple IV doses of pain meds and nausea medications [facility].   She has hx of HTN, meds are pending. She describes problems with surgery/ anesthesia which causes her BP to go"out of control" in the past. She had to have neck surgery for "collapsed bones" which were pressing on her spinal cord. No sig sequelae.   12/20-patient reports that she is much better than yesterday but still has a lot of pain she is using morphine PCA.  She denies any nausea vomiting or diarrhea.  She reports that she is hungry and is willing to try some clears today.  MRI of the abdomenFindings consistent acute pancreatitis. No evidence of pancreatic necrosis or pancreatic ductal dilatation. No organized fluid collections. 2. Common bile duct dilated but there is no obstructing lesion identified. No choledocholithiasis. Patient status post cholecystectomy. 3. Mild hepatic steatosis.  12/21-better hasnt had a bm.on clear liquids morphine pca. 12/22-patient reports that she is tolerating the clear liquids okay.  No nausea vomiting but she still has not had a bowel movement.  She reports her abdomen is swollen.  KUB that was done today stat showed she  hasThe gaseous distention of colon which may reflect a mild ileus. No evidence of small bowel obstruction/distension. No concerning mass effect or calcification. 03/23/17: Patient seen alongside patient's Nurse. Patient continues to improve. Will start IV Lasix 20mg  Bid. Advance diet as tolerated. GI input is appreciated. Likely DC in am.  Assessment & Plan:Bibasilar opacities with low bilateral lung volumes. Findings likely represent atelectasis. Component of bibasilar pneumonia cannot be excluded. There are likely small bilateral pleural effusions.    Principal Problem:   Acute pancreatitis Active Problems:   Common bile duct (CBD) obstruction   Nausea & vomiting   Abdominal pain   Substernal chest pain   Acute renal insufficiency   Dehydration   Essential hypertension   Acute biliary pancreatitis  Acute pancreatitis presumed to be due to CBD obstruction.  - Advance diet - DC IV Pain medication - Oral Pain medication - Likely DC in am.    Leukocytosis  - Stable. - Continue to monitor.    Hyponatremia - Resolved significantly.  Mild leg edema: - Gentle diuresis.   DVT prophylaxis:lovenox Code Status:full Family Communication: non family Disposition Plan:tbd  Consultants: gi  Procedures:none Antimicrobials none  Subjective: No new complaints. No SOB. No chest pain or abdominal pain. Tolerating oral intake  Objective:resting in bed much more awake, Vitals:   03/23/17 0524 03/23/17 1315 03/23/17 1326 03/23/17 1334  BP: (!) 142/87   129/68  Pulse: 100   (!) 104  Resp: 18   18  Temp: 98.1 F (36.7 C)   98.2 F (36.8 C)  TempSrc: Oral   Oral  SpO2:  100% (!) 82% 92% 95%  Weight:      Height:        Intake/Output Summary (Last 24 hours) at 03/23/2017 1702 Last data filed at 03/22/2017 1848 Gross per 24 hour  Intake 120 ml  Output -  Net 120 ml   Filed Weights   03/19/17 1657 03/20/17 0402 03/22/17 4098  Weight: 83.9 kg (185 lb) 83.9 kg (185 lb)  104.2 kg (229 lb 12.8 oz)    Examination:  General exam: Appears calm and comfortable  Respiratory system: Clear to auscultation. Respiratory effort normal. Cardiovascular system: S1 & S2 heard, RRR. No JVD, murmurs, rubs, gallops or clicks. No pedal edema. Gastrointestinal system: Abdomen is nondistended, soft and  generalizedtender. No organomegaly or masses felt. diminished bowel sounds heard. Central nervous system: Alert and oriented. No focal neurological deficits. Extremities: Symmetric 5 x 5 power. Mild leg edema  Data Reviewed: I have personally reviewed following labs and imaging studies  CBC: Recent Labs  Lab 03/18/17 2106  03/19/17 2036 03/20/17 0533 03/21/17 0620 03/22/17 0738 03/23/17 0620  WBC 9.6   < > 11.6* 13.5* 13.3* 14.1* 13.8*  NEUTROABS 8.2*  --   --   --  11.1*  --   --   HGB 15.3*   < > 13.2 12.8 13.0 11.8* 11.7*  HCT 45.0   < > 40.3 38.4 38.2 34.9* 34.9*  MCV 95.1   < > 96.9 96.0 95.7 94.3 93.6  PLT 346   < > 285 254 260 264 312   < > = values in this interval not displayed.   Basic Metabolic Panel: Recent Labs  Lab 03/18/17 2106 03/19/17 0050 03/19/17 2036 03/20/17 0533 03/21/17 0620 03/22/17 0738 03/23/17 0620  NA 136 135  --  128* 132* 133* 135  K 4.9 4.6  --  4.2 4.0 3.8 3.2*  CL 104 103  --  101 100* 102 100*  CO2 23 23  --  18* 23 22 22   GLUCOSE 125* 102*  --  65 78 91 97  BUN 16 16  --  12 9 7 7   CREATININE 1.11* 1.01* 0.92 0.83 0.68 0.57 0.69  CALCIUM 8.7* 8.2*  --  7.7* 8.2* 8.4* 8.5*  MG 1.9  --   --   --   --   --   --   PHOS 4.0  --   --   --   --   --   --    GFR: Estimated Creatinine Clearance: 94.4 mL/min (by C-G formula based on SCr of 0.69 mg/dL). Liver Function Tests: Recent Labs  Lab 03/19/17 0050 03/20/17 0533 03/21/17 0620 03/22/17 0738 03/23/17 0620  AST 164* 57* 33 25 24  ALT 239* 111* 73* 53 41  ALKPHOS 188* 124 113 96 103  BILITOT 1.2 1.2 1.2 0.9 0.8  PROT 6.6 5.8* 5.9* 5.8* 5.9*  ALBUMIN 3.6 2.8*  2.5* 2.4* 2.5*   Recent Labs  Lab 03/19/17 0050 03/20/17 0533 03/21/17 0620 03/22/17 0738 03/23/17 0620  LIPASE 1,468* 168* 46 29 25   No results for input(s): AMMONIA in the last 168 hours. Coagulation Profile: Recent Labs  Lab 03/19/17 0050  INR 0.93   Cardiac Enzymes: Recent Labs  Lab 03/19/17 0903 03/19/17 1439 03/19/17 2036  TROPONINI <0.03 <0.03 <0.03   BNP (last 3 results) No results for input(s): PROBNP in the last 8760 hours. HbA1C: No results for input(s): HGBA1C in the last 72 hours. CBG: No results for input(s): GLUCAP in  the last 168 hours. Lipid Profile: No results for input(s): CHOL, HDL, LDLCALC, TRIG, CHOLHDL, LDLDIRECT in the last 72 hours. Thyroid Function Tests: No results for input(s): TSH, T4TOTAL, FREET4, T3FREE, THYROIDAB in the last 72 hours. Anemia Panel: No results for input(s): VITAMINB12, FOLATE, FERRITIN, TIBC, IRON, RETICCTPCT in the last 72 hours. Sepsis Labs: No results for input(s): PROCALCITON, LATICACIDVEN in the last 168 hours.  No results found for this or any previous visit (from the past 240 hour(s)).       Radiology Studies: Dg Chest 2 View  Result Date: 03/22/2017 CLINICAL DATA:  Dyspnea.  Pancreatitis. EXAM: CHEST  2 VIEW COMPARISON:  08/21/2011 FINDINGS: The heart size and mediastinal contours are within normal limits. Bibasilar opacities with volume loss in both lungs likely represent atelectasis. Component of bibasilar pneumonia cannot be entirely excluded by chest x-ray. There are likely small bilateral pleural effusions, right greater than left. No pulmonary edema or pneumothoraces. No nodules detected. Bony structures are unremarkable. IMPRESSION: Bibasilar opacities with low bilateral lung volumes. Findings likely represent atelectasis. Component of bibasilar pneumonia cannot be excluded. There are likely small bilateral pleural effusions. Electronically Signed   By: Irish LackGlenn  Yamagata M.D.   On: 03/22/2017 12:28   Dg  Abd 1 View  Result Date: 03/22/2017 CLINICAL DATA:  Abdominal pain due to pancreatitis EXAM: ABDOMEN - 1 VIEW COMPARISON:  CT from 4 days ago FINDINGS: The gaseous distention of colon which may reflect a mild ileus. No evidence of small bowel obstruction/distension. No concerning mass effect or calcification. IMPRESSION: Gas distended colon which may reflect mild ileus. Electronically Signed   By: Marnee SpringJonathon  Watts M.D.   On: 03/22/2017 07:55   Dg Chest Port 1 View  Result Date: 03/23/2017 CLINICAL DATA:  Chest pain EXAM: PORTABLE CHEST 1 VIEW COMPARISON:  Yesterday FINDINGS: Low volume chest with atelectatic type opacities. Patient has history of acute pancreatitis. No edema, definite effusion, or pneumothorax. Stable heart size and mediastinal contours. IMPRESSION: Low volume chest with bilateral atelectasis. Electronically Signed   By: Marnee SpringJonathon  Watts M.D.   On: 03/23/2017 06:55   Dg Abd Portable 1v  Result Date: 03/23/2017 CLINICAL DATA:  Abdominal pain EXAM: PORTABLE ABDOMEN - 1 VIEW COMPARISON:  Yesterday FINDINGS: No change in gaseous distention of colon with scattered stool. No distended segments are primarily ascending, transverse, and rectal. No suspicious small bowel dilatation. No concerning mass effect or gas collection. Cholecystectomy clips. IMPRESSION: Unchanged gaseous distention of colon. Overall nonobstructive bowel gas pattern. Electronically Signed   By: Marnee SpringJonathon  Watts M.D.   On: 03/23/2017 06:54        Scheduled Meds: . furosemide  20 mg Intravenous Q12H   Continuous Infusions:    LOS: 5 days       Barnetta ChapelSylvester I Jerzey Komperda, MD [facility]   If 7PM-7AM, please contact night-coverage [email or web address] Password [email or web address] 03/23/2017, 5:02 PM

## 2017-03-23 NOTE — Progress Notes (Signed)
Occupational Therapy Treatment Patient Details Name: Jody KinnierBeverly L Rodriguez MRN: [id number] DOB: 02-Mar-1956 Today's Date: 03/23/2017    History of present illness 61 yo female admitted with acute pancreatitis.    OT comments  Pt feels better this day  Follow Up Recommendations  No OT follow up;Supervision - Intermittent    Equipment Recommendations  Other (comment)(reacher, LH sponge)    Recommendations for Other Services      Precautions / Restrictions Precautions Precautions: Fall Precaution Comments: monitor O2 sats       Mobility Bed Mobility Overal bed mobility: Needs Assistance Bed Mobility: Supine to Sit     Supine to sit: Supervision        Transfers Overall transfer level: Needs assistance Equipment used: Rolling walker (2 wheeled) Transfers: Sit to/from UGI CorporationStand;Stand Pivot Transfers Sit to Stand: Supervision Stand pivot transfers: Supervision       General transfer comment: close guard for safety. Increased time. ambulated in hall    Balance                                           ADL either performed or assessed with clinical judgement   ADL       Grooming: Wash/dry hands;Wash/dry face;Standing;Supervision/safety                   Toilet Transfer: Supervision/safety;BSC;Ambulation;RW   Toileting- ArchitectClothing Manipulation and Hygiene: Supervision/safety;Sit to/from stand;Cueing for sequencing;Cueing for safety         General ADL Comments: Pt walked in hall with OT.  Oxygen sats 89-93. Rn aware     Vision       Perception     Praxis      Cognition Arousal/Alertness: Awake/alert Behavior During Therapy: WFL for tasks assessed/performed Overall Cognitive Status: Within Functional Limits for tasks assessed                                                     Pertinent Vitals/ Pain       Pain Score: 4  Pain Location: abdomen Pain Descriptors / Indicators: Discomfort;Sore Pain  Intervention(s): Monitored during session;Repositioned  Home Living                                              Frequency  Min 2X/week        Progress Toward Goals  OT Goals(current goals can now be found in the care plan section)  Progress towards OT goals: Progressing toward goals     Plan Discharge plan remains appropriate       AM-PAC PT "6 Clicks" Daily Activity     Outcome Measure   Help from another person eating meals?: None Help from another person taking care of personal grooming?: A Little Help from another person toileting, which includes using toliet, bedpan, or urinal?: A Little Help from another person bathing (including washing, rinsing, drying)?: A Little Help from another person to put on and taking off regular upper body clothing?: None Help from another person to put on and taking off regular lower body clothing?: A Little 6 Click Score: 20  End of Session Equipment Utilized During Treatment: Gait belt  OT Visit Diagnosis: Unsteadiness on feet (R26.81);Pain Pain - Right/Left: (generalized) Pain - part of body: (abdomen)   Activity Tolerance Patient limited by fatigue;Patient limited by pain;Other (comment)(O2 SATs decreased to 86% after walking to bathroom and back for toileting on RA; pt put back on 2.5 L O2 and recovered to 91% < a minute)   Patient Left in bed;with call bell/phone within reach;Other (comment)(MD in room)   Nurse Communication          Time: 1007-1020 OT Time Calculation (min): 13 min  Charges: OT General Charges $OT Visit: 1 Visit OT Treatments $Self Care/Home Management : 8-22 mins  Adair VillageLori Jermell Holeman, [place]OT [phone]   Alba CoryREDDING, Jody Rodriguez 03/23/2017, 11:30 AM

## 2017-03-23 NOTE — Progress Notes (Signed)
SATURATION QUALIFICATIONS: (This note is used to comply with regulatory documentation for home oxygen)  Patient Saturations on Room Air at Rest = 92%  Patient Saturations on Room Air while Ambulating = 82%  Patient Saturations on 2 Liters of oxygen while Ambulating = 91%  Please briefly explain why patient needs home oxygen: to maintain SaO2 greater than 90%  Ralene BatheUhlenberg, Emry Tobin Kistler PT 03/23/2017  [phone]

## 2017-03-23 NOTE — Progress Notes (Addendum)
Physical Therapy Treatment Patient Details Name: Jody Rodriguez MRN: [id number] DOB: [birth year] Today's Date: 03/23/2017    History of Present Illness 61 yo female admitted with acute pancreatitis, likely B pleural effusions.    PT Comments    Pt ambulated 120' with hand held assist, SaO2 dropped to 82% on room air, HR 125 max with walking, no dyspnea noted, but pt reports noting wheezing today, RN notified. Pt tolerated increased ambulation distance, however requires supplemental O2 to maintain SaO2 greater than 90% with activity. SaO2 92% on room air at rest.    Follow Up Recommendations  Home health PT     Equipment Recommendations  Rolling walker with 5" wheels ; home O2   Recommendations for Other Services       Precautions / Restrictions Precautions Precautions: Fall Precaution Comments: monitor O2 sats Restrictions Weight Bearing Restrictions: No    Mobility  Bed Mobility Overal bed mobility: Modified Independent Bed Mobility: Supine to Sit     Supine to sit: Modified independent (Device/Increase time)        Transfers Overall transfer level: Needs assistance Equipment used: Rolling walker (2 wheeled) Transfers: Sit to/from Stand Sit to Stand: Supervision Stand pivot transfers: Supervision       General transfer comment: close guard for safety  Ambulation/Gait Ambulation/Gait assistance: Min guard Ambulation Distance (Feet): 120 Feet Assistive device: 1 person hand held assist Gait Pattern/deviations: Step-through pattern;Decreased stride length   Gait velocity interpretation: Below normal speed for age/gender General Gait Details: SaO2 82% on room air, HR 125 max, no LOB, decr velocity, SaO2 92% on room air at rest   Stairs            Wheelchair Mobility    Modified Rankin (Stroke Patients Only)       Balance Overall balance assessment: Needs assistance Sitting-balance support: No upper extremity supported;Feet  supported Sitting balance-Leahy Scale: Good     Standing balance support: During functional activity;No upper extremity supported Standing balance-Leahy Scale: Good                              Cognition Arousal/Alertness: Awake/alert Behavior During Therapy: WFL for tasks assessed/performed Overall Cognitive Status: Within Functional Limits for tasks assessed                                        Exercises      General Comments        Pertinent Vitals/Pain Pain Assessment: No/denies pain Pain Score: 4  Pain Location: abdomen Pain Descriptors / Indicators: Discomfort;Sore Pain Intervention(s): Monitored during session;Repositioned    Home Living                      Prior Function            PT Goals (current goals can now be found in the care plan section) Acute Rehab PT Goals Patient Stated Goal: to regain independence. less pain.  PT Goal Formulation: With patient/family Time For Goal Achievement: 03/28/17 Potential to Achieve Goals: Good Progress towards PT goals: Progressing toward goals    Frequency    Min 3X/week      PT Plan Current plan remains appropriate    Co-evaluation              AM-PAC PT "6 Clicks" Daily Activity  Outcome Measure  Difficulty turning over in bed (including adjusting bedclothes, sheets and blankets)?: None Difficulty moving from lying on back to sitting on the side of the bed? : None Difficulty sitting down on and standing up from a chair with arms (e.g., wheelchair, bedside commode, etc,.)?: None Help needed moving to and from a bed to chair (including a wheelchair)?: A Little Help needed walking in hospital room?: A Little Help needed climbing 3-5 steps with a railing? : A Little 6 Click Score: 21    End of Session Equipment Utilized During Treatment: Oxygen Activity Tolerance: Patient tolerated treatment well(Limited by nausea) Patient left: in chair;with call bell/phone  within reach;with family/visitor present Nurse Communication: Mobility status PT Visit Diagnosis: Muscle weakness (generalized) (M62.81);Difficulty in walking, not elsewhere classified (R26.2) Pain - part of body: (abdomen)     Time: [id number] PT Time Calculation (min) (ACUTE ONLY): 21 min  Charges:  $Gait Training: 8-22 mins                    G Codes:          Jody Rodriguez, Jody Rodriguez 03/23/2017, 1:34 PM [phone]

## 2017-03-23 NOTE — Progress Notes (Signed)
Subjective: The patient was seen and examined at bedside. She took a bath a few minutes ago and feels short of breath coming back to her bed. Reports that abdominal pain is adequately controlled. Has been able to tolerate clear liquid diet, has been burping as well as passing flatus. IV fluids was discontinued yesterday as she developed shortness of breath, became slightly hypoxic and needed oxygen by nasal cannula, was given 1 dose of IV Lasix and feels much better.  Objective: Vital signs in last 24 hours: Temp:  [98.1 F (36.7 C)-98.6 F (37 C)] 98.1 F (36.7 C) (12/23 0524) Pulse Rate:  [100-103] 100 (12/23 0524) Resp:  [18] 18 (12/23 0524) BP: (142-156)/(87-94) 142/87 (12/23 0524) SpO2:  [100 %] 100 % (12/23 0524) Weight change:  Last BM Date: (PTA)  PE: Not in acute distress GENERAL: Not tachypnea, not using accessory muscles of respiration, able to speak in full sentences ABDOMEN: Soft, mild epigastric tenderness, no rebound tenderness, rigidity, voluntary guarding, very sluggish bowel sounds EXTREMITIES: No edema, no deformity  Lab Results: Results for orders placed or performed during the hospital encounter of 03/18/17 (from the past 48 hour(s))  Comprehensive metabolic panel     Status: Abnormal   Collection Time: 03/22/17  7:38 AM  Result Value Ref Range   Sodium 133 (L) 135 - 145 mmol/L   Potassium 3.8 3.5 - 5.1 mmol/L   Chloride 102 101 - 111 mmol/L   CO2 22 22 - 32 mmol/L   Glucose, Bld 91 65 - 99 mg/dL   BUN 7 6 - 20 mg/dL   Creatinine, Ser 0.57 0.44 - 1.00 mg/dL   Calcium 8.4 (L) 8.9 - 10.3 mg/dL   Total Protein 5.8 (L) 6.5 - 8.1 g/dL   Albumin 2.4 (L) 3.5 - 5.0 g/dL   AST 25 15 - 41 U/L   ALT 53 14 - 54 U/L   Alkaline Phosphatase 96 38 - 126 U/L   Total Bilirubin 0.9 0.3 - 1.2 mg/dL   GFR calc non Af Amer >60 >60 mL/min   GFR calc Af Amer >60 >60 mL/min    Comment: (NOTE) The eGFR has been calculated using the CKD EPI equation. This calculation has not  been validated in all clinical situations. eGFR's persistently <60 mL/min signify possible Chronic Kidney Disease.    Anion gap 9 5 - 15  Lipase, blood     Status: None   Collection Time: 03/22/17  7:38 AM  Result Value Ref Range   Lipase 29 11 - 51 U/L  CBC     Status: Abnormal   Collection Time: 03/22/17  7:38 AM  Result Value Ref Range   WBC 14.1 (H) 4.0 - 10.5 K/uL   RBC 3.70 (L) 3.87 - 5.11 MIL/uL   Hemoglobin 11.8 (L) 12.0 - 15.0 g/dL   HCT 34.9 (L) 36.0 - 46.0 %   MCV 94.3 78.0 - 100.0 fL   MCH 31.9 26.0 - 34.0 pg   MCHC 33.8 30.0 - 36.0 g/dL   RDW 14.0 11.5 - 15.5 %   Platelets 264 150 - 400 K/uL  CBC     Status: Abnormal   Collection Time: 03/23/17  6:20 AM  Result Value Ref Range   WBC 13.8 (H) 4.0 - 10.5 K/uL   RBC 3.73 (L) 3.87 - 5.11 MIL/uL   Hemoglobin 11.7 (L) 12.0 - 15.0 g/dL   HCT 34.9 (L) 36.0 - 46.0 %   MCV 93.6 78.0 - 100.0 fL   MCH  31.4 26.0 - 34.0 pg   MCHC 33.5 30.0 - 36.0 g/dL   RDW 13.7 11.5 - 15.5 %   Platelets 312 150 - 400 K/uL    Studies/Results: Dg Chest 2 View  Result Date: 03/22/2017 CLINICAL DATA:  Dyspnea.  Pancreatitis. EXAM: CHEST  2 VIEW COMPARISON:  08/21/2011 FINDINGS: The heart size and mediastinal contours are within normal limits. Bibasilar opacities with volume loss in both lungs likely represent atelectasis. Component of bibasilar pneumonia cannot be entirely excluded by chest x-ray. There are likely small bilateral pleural effusions, right greater than left. No pulmonary edema or pneumothoraces. No nodules detected. Bony structures are unremarkable. IMPRESSION: Bibasilar opacities with low bilateral lung volumes. Findings likely represent atelectasis. Component of bibasilar pneumonia cannot be excluded. There are likely small bilateral pleural effusions. Electronically Signed   By: Aletta Edouard M.D.   On: 03/22/2017 12:28   Dg Abd 1 View  Result Date: 03/22/2017 CLINICAL DATA:  Abdominal pain due to pancreatitis EXAM: ABDOMEN  - 1 VIEW COMPARISON:  CT from 4 days ago FINDINGS: The gaseous distention of colon which may reflect a mild ileus. No evidence of small bowel obstruction/distension. No concerning mass effect or calcification. IMPRESSION: Gas distended colon which may reflect mild ileus. Electronically Signed   By: Monte Fantasia M.D.   On: 03/22/2017 07:55   Dg Chest Port 1 View  Result Date: 03/23/2017 CLINICAL DATA:  Chest pain EXAM: PORTABLE CHEST 1 VIEW COMPARISON:  Yesterday FINDINGS: Low volume chest with atelectatic type opacities. Patient has history of acute pancreatitis. No edema, definite effusion, or pneumothorax. Stable heart size and mediastinal contours. IMPRESSION: Low volume chest with bilateral atelectasis. Electronically Signed   By: Monte Fantasia M.D.   On: 03/23/2017 06:55   Dg Abd Portable 1v  Result Date: 03/23/2017 CLINICAL DATA:  Abdominal pain EXAM: PORTABLE ABDOMEN - 1 VIEW COMPARISON:  Yesterday FINDINGS: No change in gaseous distention of colon with scattered stool. No distended segments are primarily ascending, transverse, and rectal. No suspicious small bowel dilatation. No concerning mass effect or gas collection. Cholecystectomy clips. IMPRESSION: Unchanged gaseous distention of colon. Overall nonobstructive bowel gas pattern. Electronically Signed   By: Monte Fantasia M.D.   On: 03/23/2017 06:54    Medications: I have reviewed the patient's current medications.  Assessment: Pancreatitis-Making slow progression, possible passed CBD stones, MRCP did not show choledocholithiasis, status post cholecystectomy, normal liver enzymes Nonobstructive bowel gas pattern   Plan:  Will start patient on low fat diet, discussed with patient if she feels nauseous/has worsening of abdominal pain, we need to be notified, and may need her diet to be changed back to clear liquids or full liquids.  If abdominal pain is well controlled, she is able to tolerate low-fat diet, plan to discharge  in a.m.Marland Kitchen   Ronnette Juniper 03/23/2017, 11:39 AM   Pager [phone] If no answer or after 5 PM call [phone]

## 2017-03-24 ENCOUNTER — Inpatient Hospital Stay (HOSPITAL_COMMUNITY): Payer: BC Managed Care – PPO

## 2017-03-24 DIAGNOSIS — N289 Disorder of kidney and ureter, unspecified: Secondary | ICD-10-CM

## 2017-03-24 DIAGNOSIS — R1011 Right upper quadrant pain: Secondary | ICD-10-CM

## 2017-03-24 LAB — BASIC METABOLIC PANEL
Anion gap: 10 (ref 5–15)
BUN: 6 mg/dL (ref 6–20)
CO2: 28 mmol/L (ref 22–32)
Calcium: 8.6 mg/dL — ABNORMAL LOW (ref 8.9–10.3)
Chloride: 98 mmol/L — ABNORMAL LOW (ref 101–111)
Creatinine, Ser: 0.73 mg/dL (ref 0.44–1.00)
GFR calc Af Amer: >60 mL/min (ref >60)
GFR calc non Af Amer: >60 mL/min (ref >60)
Glucose, Bld: 125 mg/dL — ABNORMAL HIGH (ref 65–99)
Potassium: 3.6 mmol/L (ref 3.5–5.1)
Sodium: 136 mmol/L (ref 135–145)

## 2017-03-24 MED ORDER — ONDANSETRON HCL 4 MG PO TABS: 4.0000 mg | ORAL_TABLET | Freq: Four times a day (QID) | ORAL | 0 refills | Status: AC | PRN

## 2017-03-24 MED ORDER — OXYCODONE HCL 5 MG PO TABS: 5.0000 mg | ORAL_TABLET | ORAL | 0 refills | Status: AC | PRN

## 2017-03-24 NOTE — Discharge Summary (Signed)
Physician Discharge Summary  Patient ID: Jody KinnierBeverly L Gabay MRN: [id number] DOB/AGE: [birth year] 61 y.o.  Admit date: 03/18/2017 Discharge date: 03/24/2017  Admission Diagnoses:  Discharge Diagnoses:  Principal Problem:   Acute pancreatitis Active Problems:   Common bile duct (CBD) obstruction   Nausea & vomiting   Abdominal pain   Substernal chest pain   Acute renal insufficiency   Dehydration   Essential hypertension   Acute biliary pancreatitis   Discharged Condition: stable  Hospital Course: Patient is a 61 year old female with past medical history significant for hypertension, and status post cholecystectomy. Patient was admitted with gallstone pancreatitis. CT of the abdomen showed acute pancreatitis without necrosis or pseudocyst, dilated common bile duct measuring 14 mm, abrupt tapering of distal CBD just above the ampulla, no radiopaque stones noted. 7 mm low density lesion in the uncinate process was noted. MRI is recommended in 6 months. Patient was managed supportively. MRI/MRCP of the abdomen did not show any stone. Patient has improved significantly, and will be discharged back to the PCP. The GI Team assisted in directing the patient's care.   Consults: GI  Significant Diagnostic Studies: radiology: MRI: CT/MRI/MRCP Abdomen  Discharge Exam: Blood pressure (!) 152/86, pulse 95, temperature 98.9 F (37.2 C), temperature source Oral, resp. rate 18, height 5\' 8"  (1.727 m), weight 99.4 kg (219 lb 2.2 oz), SpO2 91 %.  Disposition:   Discharge Instructions    Call MD for:   Complete by:  As directed    Call MD with worsening symptoms   Diet - low sodium heart healthy   Complete by:  As directed    Increase activity slowly   Complete by:  As directed      Allergies as of 03/24/2017      Reactions   Gabapentin Shortness Of Breath      Medication List    STOP taking these medications   zolpidem 10 MG tablet Commonly known as:  AMBIEN     TAKE these  medications   amitriptyline 25 MG tablet Commonly known as:  ELAVIL Take 25 mg by mouth daily.   BUSPAR PO Take 15 mg of piperacillin by mouth daily as needed (anxiety).   CALCIUM PO Take 1 tablet by mouth daily.   enalapril 20 MG tablet Commonly known as:  VASOTEC Take 20 mg by mouth daily.   Levothyroxine Sodium 50 MCG Caps Take 50 mcg by mouth daily.   METOPROLOL TARTRATE PO Take 0.5 tablets by mouth 2 (two) times daily.   ondansetron 4 MG tablet Commonly known as:  ZOFRAN Take 1 tablet (4 mg total) by mouth every 6 (six) hours as needed for nausea.   oxyCODONE 5 MG immediate release tablet Commonly known as:  Oxy IR/ROXICODONE Take 1 tablet (5 mg total) by mouth every 4 (four) hours as needed for up to 7 days for moderate pain or severe pain.   PREMARIN 0.3 MG tablet Generic drug:  estrogens (conjugated) Take 0.3 mg by mouth daily.   tiZANidine 2 MG tablet Commonly known as:  ZANAFLEX Take 2 mg by mouth as needed (muscle spasms).   triamterene-hydrochlorothiazide 37.5-25 MG capsule Commonly known as:  DYAZIDE Take 1 capsule by mouth daily.        SignedBarnetta Chapel: Alfard Cochrane I Gerda Yin 03/24/2017, 3:35 PM

## 2017-03-24 NOTE — Progress Notes (Signed)
Date:  March 24, 2017 Chart reviewed for concurrent status and case management needs.  Will continue to follow patient progress.  Discharge Planning: following for needs  Expected discharge date: December 247 2018  Rhonda Davis, BSN, RN3, CCM   [phone]  

## 2017-03-24 NOTE — Progress Notes (Signed)
Subjective: The patient was seen and examined at bedside. Has been able to tolerate solids. Complains of mild nausea without vomiting, abdominal pain seems to be adequately controlled. Does not feel short of breath and is able to walk in the hallways.  Objective: Vital signs in last 24 hours: Temp:  [98.2 F (36.8 C)-99 F (37.2 C)] 98.9 F (37.2 C) (12/24 0511) Pulse Rate:  [95-104] 95 (12/24 0511) Resp:  [18] 18 (12/24 0511) BP: (129-152)/(68-86) 152/86 (12/24 0511) SpO2:  [82 %-95 %] 91 % (12/24 0511) Weight:  [99.4 kg (219 lb 2.2 oz)] 99.4 kg (219 lb 2.2 oz) (12/24 0511) Weight change:  Last BM Date: (PTA)  PE: Not in acute distress, sitting on a bedside chair GENERAL: No icterus, no pallor ABDOMEN: Soft, nondistended, mild epigastric tenderness, sluggish bowel sounds EXTREMITIES: No edema, no deformity  Lab Results: Results for orders placed or performed during the hospital encounter of 03/18/17 (from the past 48 hour(s))  CBC     Status: Abnormal   Collection Time: 03/23/17  6:20 AM  Result Value Ref Range   WBC 13.8 (H) 4.0 - 10.5 K/uL   RBC 3.73 (L) 3.87 - 5.11 MIL/uL   Hemoglobin 11.7 (L) 12.0 - 15.0 g/dL   HCT 34.9 (L) 36.0 - 46.0 %   MCV 93.6 78.0 - 100.0 fL   MCH 31.4 26.0 - 34.0 pg   MCHC 33.5 30.0 - 36.0 g/dL   RDW 13.7 11.5 - 15.5 %   Platelets 312 150 - 400 K/uL  Comprehensive metabolic panel     Status: Abnormal   Collection Time: 03/23/17  6:20 AM  Result Value Ref Range   Sodium 135 135 - 145 mmol/L   Potassium 3.2 (L) 3.5 - 5.1 mmol/L   Chloride 100 (L) 101 - 111 mmol/L   CO2 22 22 - 32 mmol/L   Glucose, Bld 97 65 - 99 mg/dL   BUN 7 6 - 20 mg/dL   Creatinine, Ser 0.69 0.44 - 1.00 mg/dL   Calcium 8.5 (L) 8.9 - 10.3 mg/dL   Total Protein 5.9 (L) 6.5 - 8.1 g/dL   Albumin 2.5 (L) 3.5 - 5.0 g/dL   AST 24 15 - 41 U/L   ALT 41 14 - 54 U/L   Alkaline Phosphatase 103 38 - 126 U/L   Total Bilirubin 0.8 0.3 - 1.2 mg/dL   GFR calc non Af Amer >60 >60  mL/min   GFR calc Af Amer >60 >60 mL/min    Comment: (NOTE) The eGFR has been calculated using the CKD EPI equation. This calculation has not been validated in all clinical situations. eGFR's persistently <60 mL/min signify possible Chronic Kidney Disease.    Anion gap 13 5 - 15  Lipase, blood     Status: None   Collection Time: 03/23/17  6:20 AM  Result Value Ref Range   Lipase 25 11 - 51 U/L  Basic metabolic panel     Status: Abnormal   Collection Time: 03/24/17  5:45 AM  Result Value Ref Range   Sodium 136 135 - 145 mmol/L   Potassium 3.6 3.5 - 5.1 mmol/L   Chloride 98 (L) 101 - 111 mmol/L   CO2 28 22 - 32 mmol/L   Glucose, Bld 125 (H) 65 - 99 mg/dL   BUN 6 6 - 20 mg/dL   Creatinine, Ser 0.73 0.44 - 1.00 mg/dL   Calcium 8.6 (L) 8.9 - 10.3 mg/dL   GFR calc non Af Amer >60 >  60 mL/min   GFR calc Af Amer >60 >60 mL/min    Comment: (NOTE) The eGFR has been calculated using the CKD EPI equation. This calculation has not been validated in all clinical situations. eGFR's persistently <60 mL/min signify possible Chronic Kidney Disease.    Anion gap 10 5 - 15    Studies/Results: Dg Chest 2 View  Result Date: 03/22/2017 CLINICAL DATA:  Dyspnea.  Pancreatitis. EXAM: CHEST  2 VIEW COMPARISON:  08/21/2011 FINDINGS: The heart size and mediastinal contours are within normal limits. Bibasilar opacities with volume loss in both lungs likely represent atelectasis. Component of bibasilar pneumonia cannot be entirely excluded by chest x-ray. There are likely small bilateral pleural effusions, right greater than left. No pulmonary edema or pneumothoraces. No nodules detected. Bony structures are unremarkable. IMPRESSION: Bibasilar opacities with low bilateral lung volumes. Findings likely represent atelectasis. Component of bibasilar pneumonia cannot be excluded. There are likely small bilateral pleural effusions. Electronically Signed   By: Aletta Edouard M.D.   On: 03/22/2017 12:28   Dg  Chest Port 1 View  Result Date: 03/24/2017 CLINICAL DATA:  Followup hypoxia. EXAM: PORTABLE CHEST 1 VIEW COMPARISON:  03/23/2017.  03/22/2017 FINDINGS: Heart size is normal. There is slight improvement in left lower lobe aeration, with mild patchy infiltrate/atelectasis persisting. There is persistent infiltrate in partial volume loss of the right lower lung. IMPRESSION: Persistent lower lung atelectasis/ infiltrates. Slightly improved aeration in the left lower lobe. Electronically Signed   By: Nelson Chimes M.D.   On: 03/24/2017 06:56   Dg Chest Port 1 View  Result Date: 03/23/2017 CLINICAL DATA:  Chest pain EXAM: PORTABLE CHEST 1 VIEW COMPARISON:  Yesterday FINDINGS: Low volume chest with atelectatic type opacities. Patient has history of acute pancreatitis. No edema, definite effusion, or pneumothorax. Stable heart size and mediastinal contours. IMPRESSION: Low volume chest with bilateral atelectasis. Electronically Signed   By: Monte Fantasia M.D.   On: 03/23/2017 06:55   Dg Abd Portable 1v  Result Date: 03/23/2017 CLINICAL DATA:  Abdominal pain EXAM: PORTABLE ABDOMEN - 1 VIEW COMPARISON:  Yesterday FINDINGS: No change in gaseous distention of colon with scattered stool. No distended segments are primarily ascending, transverse, and rectal. No suspicious small bowel dilatation. No concerning mass effect or gas collection. Cholecystectomy clips. IMPRESSION: Unchanged gaseous distention of colon. Overall nonobstructive bowel gas pattern. Electronically Signed   By: Monte Fantasia M.D.   On: 03/23/2017 06:54    Medications: I have reviewed the patient's current medications.  Assessment: Biliary pancreatitis,  Post cholecystectomy, dilated CBD without choledocholithiasis, normal LFTs Doing well, hemodynamically stable, pain adequately controlled, normal renal function.  Plan: Okay to discharge from GI standpoint. Patient on oxycodone 5 mg every 4 hours when necessary for pain, and may need  pain medication for the next few days. Discussed with the patient to continue low-fat//fat free diet for several weeks. Patient to follow-up with [facility] in 4-6 weeks after discharge.   Ronnette Juniper 03/24/2017, 11:47 AM   Pager [phone] If no answer or after 5 PM call [phone]

## 2017-03-24 NOTE — Progress Notes (Signed)
Occupational Therapy Treatment Patient Details Name: Jody Rodriguez MRN: [id number] DOB: Aug 21, 1955 Today's Date: 03/24/2017    History of present illness 61 yo female admitted with acute pancreatitis, likely B pleural effusions.   OT comments  Pt much improved this day  Follow Up Recommendations  No OT follow up;Supervision - Intermittent    Equipment Recommendations  Other (comment)(reacher, LH sponge)    Recommendations for Other Services      Precautions / Restrictions Precautions Precautions: Fall Precaution Comments: monitor O2 sats Restrictions Weight Bearing Restrictions: No       Mobility Bed Mobility               General bed mobility comments: pt in chair  Transfers Overall transfer level: Needs assistance Equipment used: Rolling walker (2 wheeled) Transfers: Sit to/from BJ'sStand;Stand Pivot Transfers Sit to Stand: Supervision Stand pivot transfers: Supervision            Balance Overall balance assessment: Needs assistance Sitting-balance support: No upper extremity supported;Feet supported Sitting balance-Leahy Scale: Good     Standing balance support: During functional activity;No upper extremity supported Standing balance-Leahy Scale: Good                             ADL either performed or assessed with clinical judgement   ADL Overall ADL's : Needs assistance/impaired                         Toilet Transfer: Supervision/safety;Ambulation;RW   Toileting- Clothing Manipulation and Hygiene: Supervision/safety;Sit to/from stand       Functional mobility during ADLs: Supervision/safety;Rolling walker General ADL Comments: Pt overall S with ADL activity .  Pts sats over 93 entire OT session.  Husband will A as needed.                 Cognition Arousal/Alertness: Awake/alert Behavior During Therapy: WFL for tasks assessed/performed Overall Cognitive Status: Within Functional Limits for tasks assessed                                                      Pertinent Vitals/ Pain       Pain Assessment: No/denies pain     Prior Functioning/Environment              Frequency  Min 2X/week        Progress Toward Goals  OT Goals(current goals can now be found in the care plan section)  Progress towards OT goals: Progressing toward goals     Plan Discharge plan remains appropriate    Co-evaluation                 AM-PAC PT "6 Clicks" Daily Activity     Outcome Measure   Help from another person eating meals?: None Help from another person taking care of personal grooming?: A Little Help from another person toileting, which includes using toliet, bedpan, or urinal?: A Little Help from another person bathing (including washing, rinsing, drying)?: A Little Help from another person to put on and taking off regular upper body clothing?: None Help from another person to put on and taking off regular lower body clothing?: A Little 6 Click Score: 20    End of Session Equipment Utilized During Treatment: Rolling walker  OT Visit Diagnosis: Unsteadiness on feet (R26.81);Pain Pain - Right/Left: (generalized) Pain - part of body: (abdomen)   Activity Tolerance Patient tolerated treatment well(O2 SATs decreased to 86% after walking to bathroom and back for toileting on RA; pt put back on 2.5 L O2 and recovered to 91% < a minute)   Patient Left with call bell/phone within reach;Other (comment);in chair(MD in room)   Nurse Communication          Time: [phone] OT Time Calculation (min): 13 min  Charges: OT General Charges $OT Visit: 1 Visit OT Treatments $Self Care/Home Management : 8-22 mins  RidgelyLori Wealthy Rodriguez, [place]OT [phone]   Alba CoryREDDING, Jody Rodriguez 03/24/2017, 12:15 PM

## 2017-03-24 NOTE — Progress Notes (Signed)
PT Cancellation Note  Patient Details Name: Hollace KinnierBeverly L Hesler MRN: [id number] DOB: 04-24-1955   Cancelled Treatment:    Reason Eval/Treat Not Completed: Other (comment)(pt ambulated earlier today, with SaO2 greater than 90% on room air. Stated she is going home this afternoon. )   Tamala SerUhlenberg, Milee Qualls Kistler 03/24/2017, 1:47 PM [phone]

## 2017-04-03 ENCOUNTER — Encounter (HOSPITAL_COMMUNITY): Payer: Self-pay | Admitting: Emergency Medicine

## 2017-04-03 ENCOUNTER — Emergency Department (HOSPITAL_COMMUNITY)
Admission: EM | Admit: 2017-04-03 | Discharge: 2017-04-04 | Disposition: A | Discharge status: Home / Self Care | Payer: BC Managed Care – PPO | Attending: Emergency Medicine | Admitting: Emergency Medicine

## 2017-04-03 DIAGNOSIS — Z5321 Procedure and treatment not carried out due to patient leaving prior to being seen by health care provider: Secondary | ICD-10-CM | POA: Insufficient documentation

## 2017-04-03 DIAGNOSIS — R109 Unspecified abdominal pain: Secondary | ICD-10-CM | POA: Diagnosis not present

## 2017-04-03 LAB — CBC
HEMATOCRIT: 42.8 % (ref 36.0–46.0)
Hemoglobin: 15 g/dL (ref 12.0–15.0)
MCH: 32.5 pg (ref 26.0–34.0)
MCHC: 35 g/dL (ref 30.0–36.0)
MCV: 92.6 fL (ref 78.0–100.0)
Platelets: 434 10*3/uL — ABNORMAL HIGH (ref 150–400)
RBC: 4.62 MIL/uL (ref 3.87–5.11)
RDW: 13.1 % (ref 11.5–15.5)
WBC: 7.8 10*3/uL (ref 4.0–10.5)

## 2017-04-03 LAB — COMPREHENSIVE METABOLIC PANEL
ALT: 26 U/L (ref 14–54)
AST: 33 U/L (ref 15–41)
Albumin: 4 g/dL (ref 3.5–5.0)
Alkaline Phosphatase: 110 U/L (ref 38–126)
Anion gap: 10 (ref 5–15)
BUN: 10 mg/dL (ref 6–20)
CHLORIDE: 98 mmol/L — AB (ref 101–111)
CO2: 27 mmol/L (ref 22–32)
Calcium: 9.8 mg/dL (ref 8.9–10.3)
Creatinine, Ser: 1.17 mg/dL — ABNORMAL HIGH (ref 0.44–1.00)
GFR, EST AFRICAN AMERICAN: 57 mL/min — AB (ref >60)
GFR, EST NON AFRICAN AMERICAN: 50 mL/min — AB (ref >60)
Glucose, Bld: 125 mg/dL — ABNORMAL HIGH (ref 65–99)
POTASSIUM: 3.9 mmol/L (ref 3.5–5.1)
Sodium: 135 mmol/L (ref 135–145)
Total Bilirubin: 0.6 mg/dL (ref 0.3–1.2)
Total Protein: 7.5 g/dL (ref 6.5–8.1)

## 2017-04-03 LAB — I-STAT BETA HCG BLOOD, ED (MC, WL, AP ONLY): I-stat hCG, quantitative: <5 m[IU]/mL (ref <5)

## 2017-04-03 LAB — LIPASE, BLOOD: LIPASE: 24 U/L (ref 11–51)

## 2017-04-03 MED ORDER — ONDANSETRON 4 MG PO TBDP
4.0000 mg | ORAL_TABLET | Freq: Once | ORAL | Status: AC | PRN
Start: 2017-04-03 — End: 2017-04-03
  Administered 2017-04-03: 4 mg via ORAL
  Filled 2017-04-03: qty 1

## 2017-04-03 NOTE — ED Triage Notes (Signed)
[facility] for possible complications, ?necrotic or pseudocyst. Patient had nausea since 18th when admitted for pancreatitis. Had blood work on 31st with normal lipase but had elevated PLT and new renal impairment.

## 2017-04-09 ENCOUNTER — Other Ambulatory Visit: Payer: Self-pay | Admitting: Gastroenterology

## 2017-04-09 DIAGNOSIS — K858 Other acute pancreatitis without necrosis or infection: Secondary | ICD-10-CM

## 2017-04-15 ENCOUNTER — Ambulatory Visit
Admission: RE | Admit: 2017-04-15 | Discharge: 2017-04-15 | Disposition: A | Discharge status: Home / Self Care | Payer: BC Managed Care – PPO | Source: Ambulatory Visit | Attending: Gastroenterology | Admitting: Gastroenterology

## 2017-04-15 DIAGNOSIS — K858 Other acute pancreatitis without necrosis or infection: Secondary | ICD-10-CM

## 2017-04-15 MED ORDER — IOPAMIDOL (ISOVUE-300) INJECTION 61%
100.0000 mL | Freq: Once | INTRAVENOUS | Status: AC | PRN
  Administered 2017-04-15: 100 mL via INTRAVENOUS

## 2017-05-29 ENCOUNTER — Other Ambulatory Visit: Payer: Self-pay | Admitting: Gastroenterology

## 2017-05-29 DIAGNOSIS — R1011 Right upper quadrant pain: Secondary | ICD-10-CM

## 2017-06-12 ENCOUNTER — Ambulatory Visit
Admission: RE | Admit: 2017-06-12 | Discharge: 2017-06-12 | Disposition: A | Discharge status: Home / Self Care | Payer: BC Managed Care – PPO | Source: Ambulatory Visit | Attending: Gastroenterology | Admitting: Gastroenterology

## 2017-06-12 DIAGNOSIS — R1011 Right upper quadrant pain: Secondary | ICD-10-CM

## 2017-06-12 MED ORDER — IOPAMIDOL (ISOVUE-300) INJECTION 61%
100.0000 mL | Freq: Once | INTRAVENOUS | Status: AC | PRN
  Administered 2017-06-12: 100 mL via INTRAVENOUS

## 2018-09-29 IMAGING — CT CT ABDOMEN W/ CM
1 of 3 series · 13 of 32 positions shown, 19 images · IV contrast (APPLIED)
Comparison: Multiple exams, including CT abdomen 03/18/2017 and MR
abdomen 03/19/2017

CLINICAL DATA: Acute pancreatitis. Right upper quadrant abdominal
pain

EXAM:
CT ABDOMEN WITH CONTRAST
TECHNIQUE: Multidetector CT imaging of the abdomen was performed using the
standard protocol following bolus administration of intravenous
contrast.
CONTRAST:  100mL OCC8QX-3TT IOPAMIDOL (OCC8QX-3TT) INJECTION 61%

[Series 2: abdroutine 5.0 i31s 1 · axial · 0.84mm/px · z∈[-96,+144]mm · 13 of 56 slices shown, 19 images]
[im 4/56  soft-tissue]
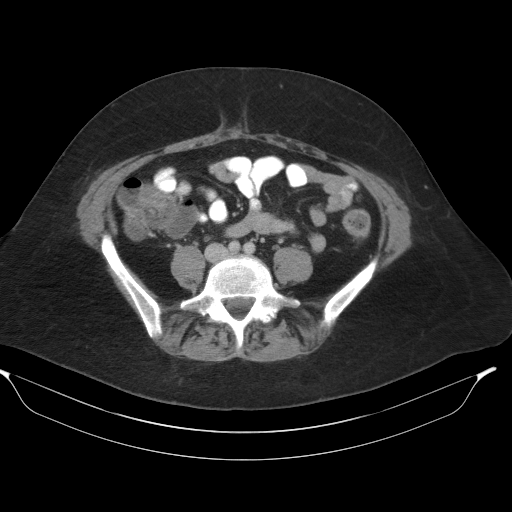
[im 4/56  bone]
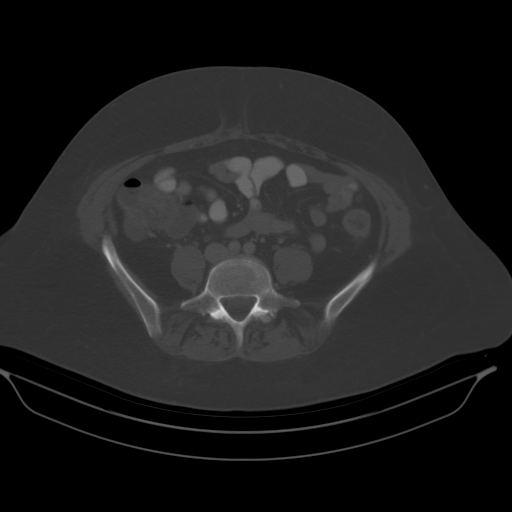
[im 8/56  soft-tissue]
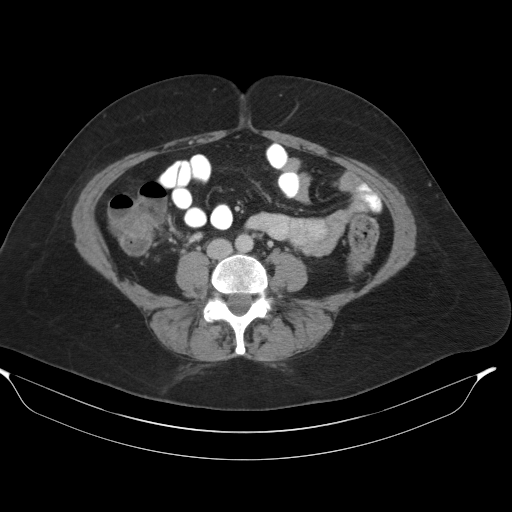
[im 12/56  soft-tissue]
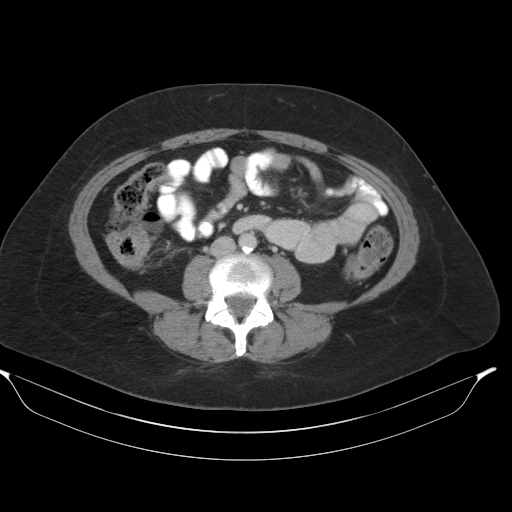
[im 15/56  soft-tissue]
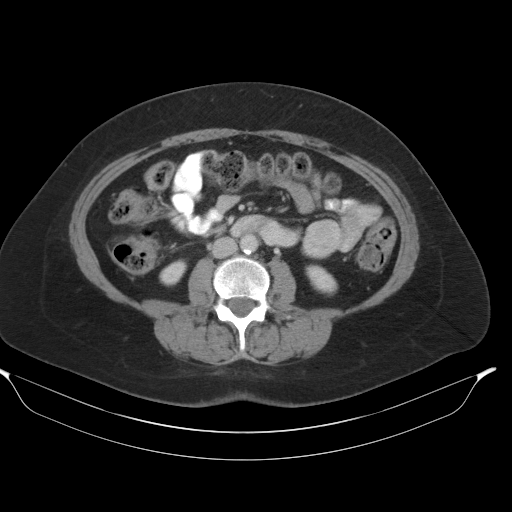
[im 19/56  soft-tissue]
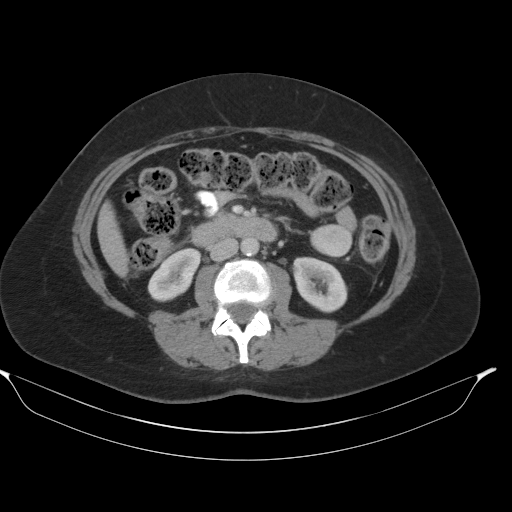
[im 23/56  soft-tissue]
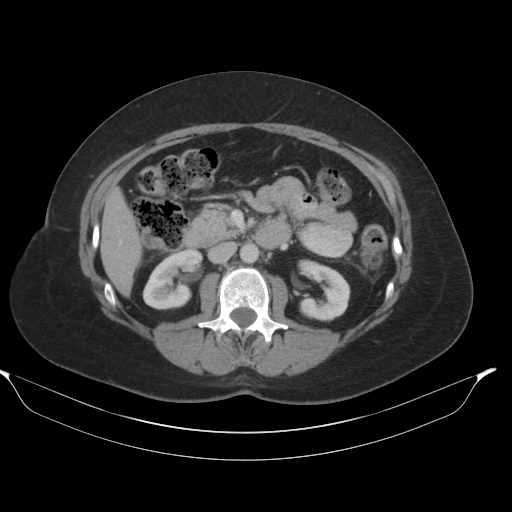
[im 30/56  soft-tissue]
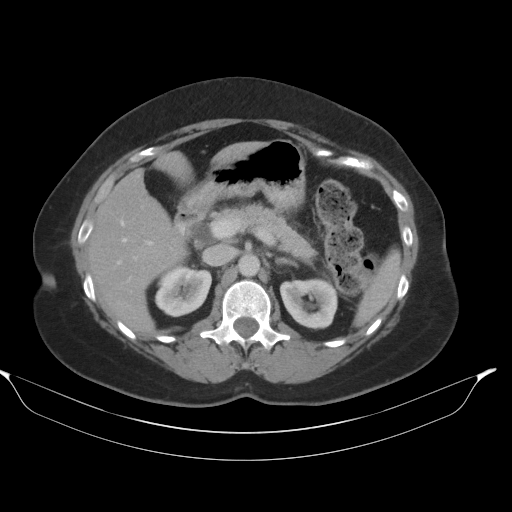
[im 34/56  soft-tissue]
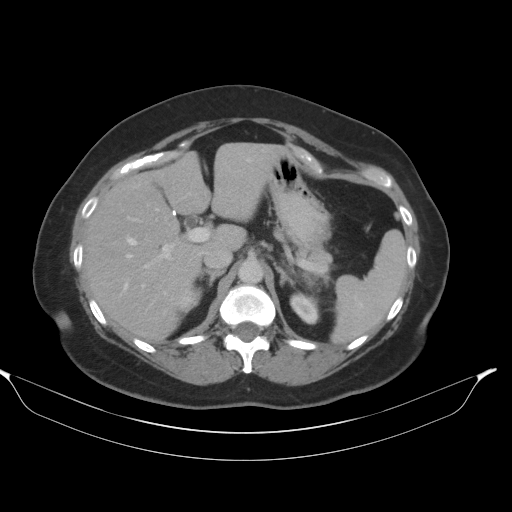
[im 37/56  soft-tissue]
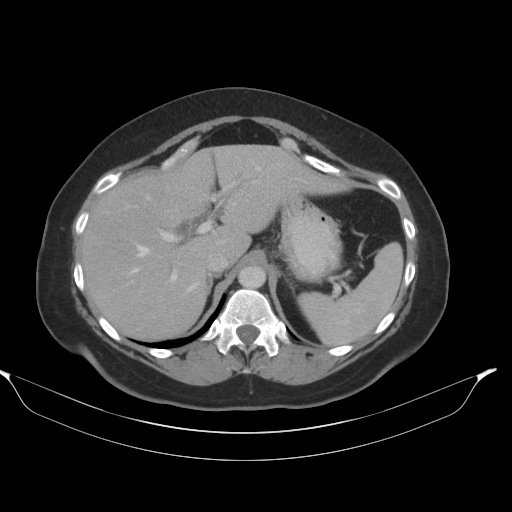
[im 37/56  bone]
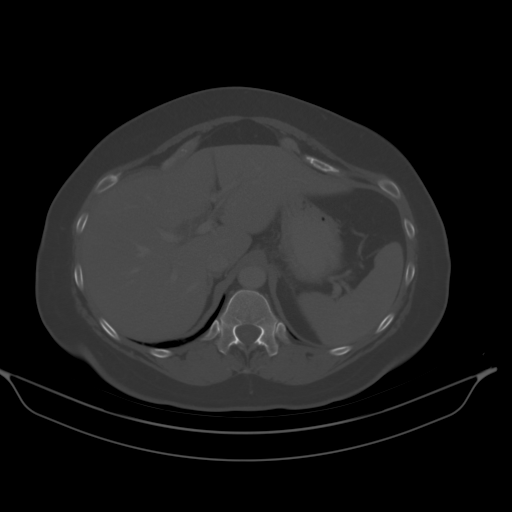
[im 41/56  soft-tissue]
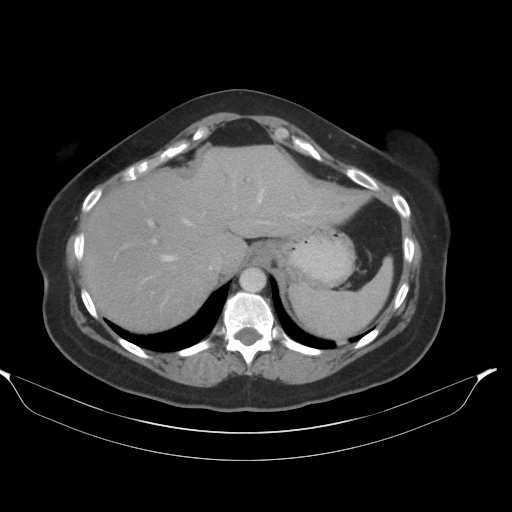
[im 41/56  lung]
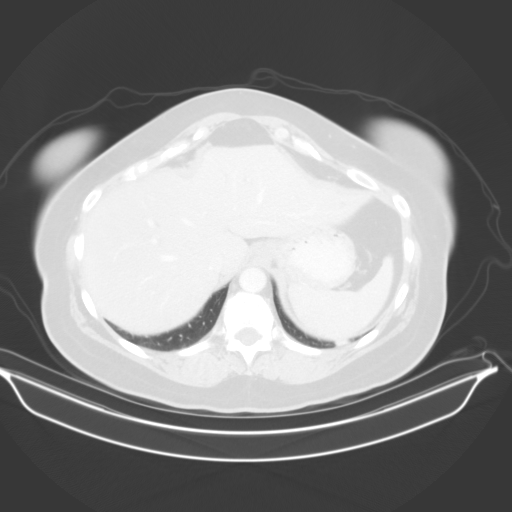
[im 45/56  soft-tissue]
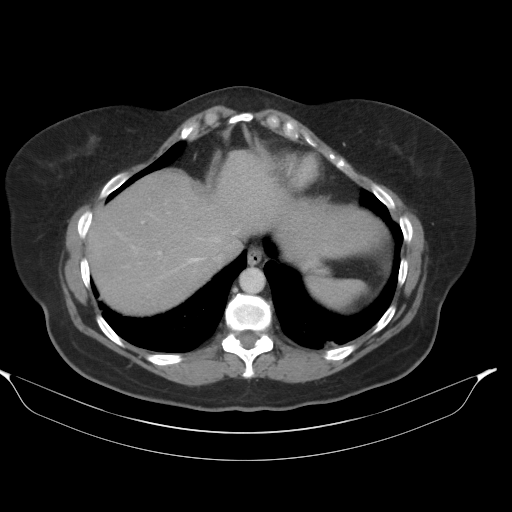
[im 45/56  lung]
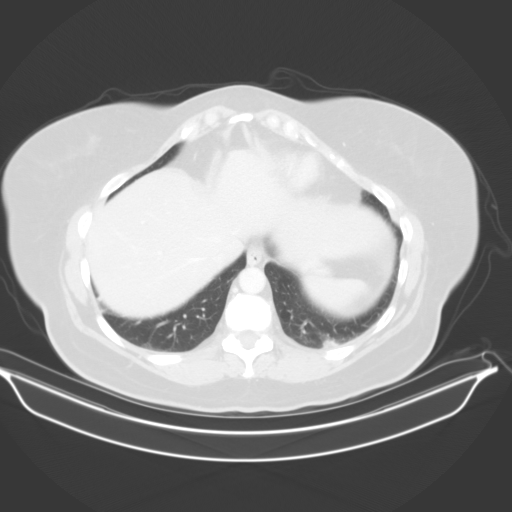
[im 48/56  soft-tissue]
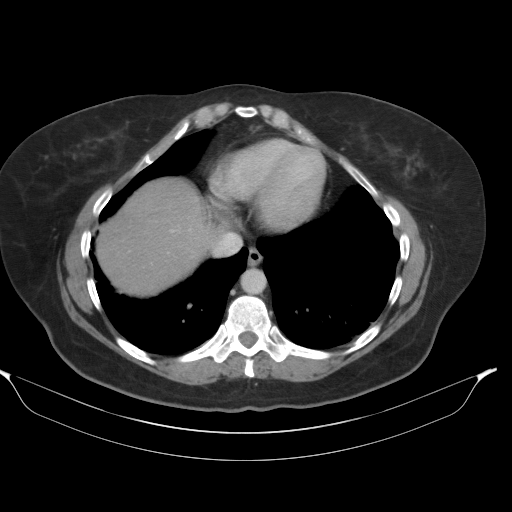
[im 48/56  lung]
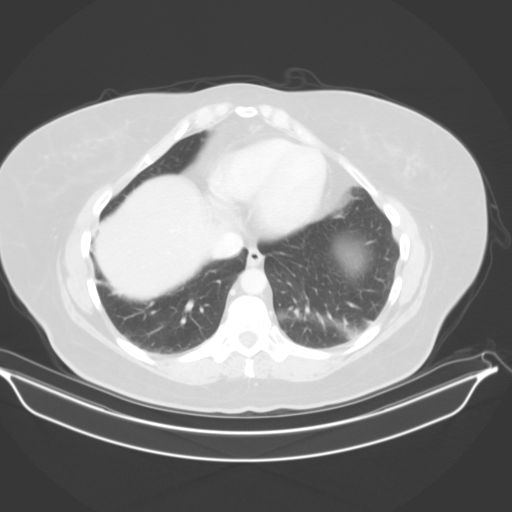
[im 52/56  soft-tissue]
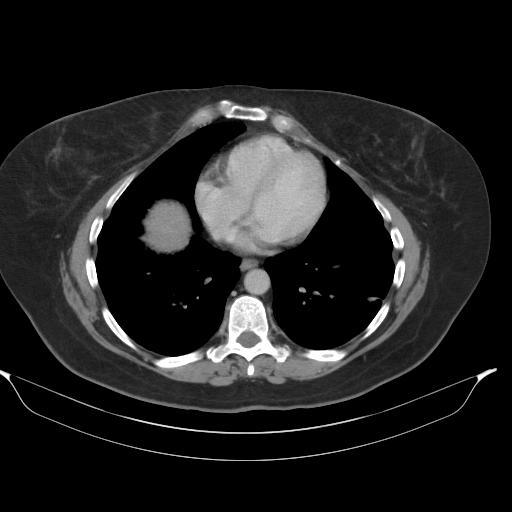
[im 52/56  lung]
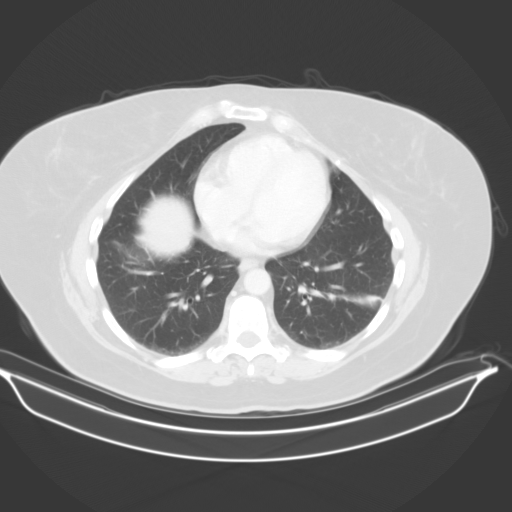

[13 of 32 positions shown; findings below may reference images not displayed]

FINDINGS: Lower chest: Bandlike atelectasis along the right hemidiaphragm and
in the left lower lobe and lingula. Mild pectus carinatum.

Hepatobiliary: 0.6 by 0.4 cm hypodense lesion in segment 2 of the
liver on image [DATE], stable and likely a small cyst or similar
benign lesion. Cholecystectomy noted. Common bile duct 1.1 cm in
diameter on image [DATE]. Minimal intrahepatic biliary dilatation.

Pancreas: Subtle peripancreatic edema/stranding, improved compared
to 03/18/2017. No dorsal pancreatic duct dilatation. No compelling
findings of pancreas divisum. No pancreatic necrosis, pseudocyst, or
abscess.

Spleen: Unremarkable

Adrenals/Urinary Tract: Unremarkable

Stomach/Bowel: Borderline dilated loop of jejunum in the left
abdomen at 3.0 cm. Formed stool in the visualized colon.

Vascular/Lymphatic: Aortoiliac atherosclerotic vascular disease.

Other: Low-level edema along the left paracolic gutter.

Musculoskeletal: Unremarkable
IMPRESSION: 1. Peripancreatic stranding has improved compared to 03/18/2017.
There is some minimal residual peripancreatic stranding and
low-level edema tracking along the left paracolic gutter. No
pancreatic abscess, pseudocyst, or necrosis.
2. Borderline dilated loop of left upper quadrant jejunum at 3.0 cm
in diameter, nonspecific but mild local ileus could have this
appearance. No obstruction identified.
3. Dilated CBD at 1.1 cm in diameter (previously 1.3 cm), much of
this may well represent a physiologic response to cholecystectomy.
4. Mild bibasilar atelectasis.
5. Mild pectus carinatum.
6.  Aortic Atherosclerosis ([id number]).
